# Patient Record
Sex: Female | Born: 2000 | Race: White | Hispanic: No | Marital: Single | Smoking: Never smoker
Health system: Southern US, Community
[De-identification: ages and names within clinical notes are randomized; demographics above are authoritative.]

## PROBLEM LIST (undated history)

## (undated) DIAGNOSIS — Q8789 Other specified congenital malformation syndromes, not elsewhere classified: Secondary | ICD-10-CM

## (undated) DIAGNOSIS — H6693 Otitis media, unspecified, bilateral: Secondary | ICD-10-CM

## (undated) DIAGNOSIS — Q8783 Bardet-Biedl syndrome: Secondary | ICD-10-CM

## (undated) HISTORY — PX: GALLBLADDER SURGERY: SHX652

## (undated) HISTORY — DX: Other specified congenital malformation syndromes, not elsewhere classified: Q87.89

## (undated) HISTORY — DX: Bardet-Biedl syndrome: Q87.83

## (undated) HISTORY — DX: Otitis media, unspecified, bilateral: H66.93

---
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
## 2021-01-29 ENCOUNTER — Telehealth: Payer: Self-pay | Admitting: Student in an Organized Health Care Education/Training Program

## 2021-01-29 DIAGNOSIS — Q8789 Other specified congenital malformation syndromes, not elsewhere classified: Secondary | ICD-10-CM

## 2021-01-29 NOTE — Telephone Encounter (Signed)
Called mother to offer genetic counseling. Mother would like this done so agreed to genetics referral. Order placed for Dr. Roetta Sessions and Aimee for genetic counseling.

## 2021-01-30 ENCOUNTER — Ambulatory Visit: Payer: Medicaid Other | Admitting: Student in an Organized Health Care Education/Training Program

## 2021-02-13 NOTE — Progress Notes (Addendum)
    SUBJECTIVE:   CHIEF COMPLAINT / HPI:   Hypothyroidism Most recent TSH 06/2020 was 5.520. She is currently not on any thyroid medication. She feels that she has been gaining weight, having irregular periods. Mother feels that she sweats a lot. No problems with constipation. No change in diet or exercise since last visit. Loves to dance. Very picky eater, does not eat vegetables. Used to get up in the middle of the night and eat "mayonnaise and sugar". Has seen a nutritionist in the past, doesn't help.   Concern with Menstrual Cycle  Patient notes that her periods are irregular, last LMP 12/05/20. On Micronor now due to having headaches on combined OCPs.  Her concern is that she will not be able to have kids if she does not have a period.  She does not have a partner currently and is not sexually active.  PERTINENT  PMH / PSH: Bardet-Biedl syndrome, PCOS, obesity, hypothyroidism  OBJECTIVE:   BP 122/80   Pulse (!) 116   Ht 5' 8.5" (1.74 m)   Wt (!) 324 lb (147 kg)   LMP 12/05/2020   SpO2 98%   BMI 48.55 kg/m    General: NAD, pleasant, acts younger than age, able to participate in exam Respiratory: Breathing comfortably on room air, no respiratory distress Abdomen: Obese Psych: Normal affect and mood  ASSESSMENT/PLAN:   Subclinical hypothyroidism Will recheck TSH, Free T4 and Free T3 today. Patient with notable 18 lb weight gain since last visit in November 2021. Suspect this is likely a result of poor diet. Does have irregular periods but is on Micronor which could be contributing. No other symptoms consistent with hypothyroidism. Has never been on any thyroid medications.  -TSH, Free T3, Free T4 today -F/u labs and evaluate need for treatment  Menstrual periods irregular Since starting Micronor.  Reassuringly, she has not had any more headaches since starting progesterone only pills.  She takes these at the same time every day.  She is not currently sexually active.  Shared  decision making to stay on progesterone only pills.  Reassured her about the irregular periods, likely a side effect of the birth control.   Tachycardia Unable to recheck heart rate today prior to patient leaving.  Patient without any other concerns today besides problems above. Cardiac examination not done in office but patient appeared in no distress. Attempted to call number listed in chart (mother's number) unsuccessfully. MyChart message sent to patient regarding scheduling nurse visit for heart rate recheck.   Hyperlipidemia Currently on atorvastatin 20 mg.  Last lipid panel was in November 2021.  Unclear why patient is on atorvastatin at this time given her age.  She is a very picky eater and diet has been challenging. Working towards incorporating more exercise. Will recheck lipid panel since starting on atorvastatin. Will defer to PCP to have further discussions with patient/mother about continuing on statin vs. Lifestyle modifications which would be first-line for her age.  Obesity BMI 48. With 18 lb weight increase since last visit. Discussed diet and exercise recommendations. Patient is very picky eater which makes improving diet challenging. Has seen nutritionist in past which mother states did not help much. Patient and mother will work on increasing exercise.      Sabino Dick, DO [facility]

## 2021-02-13 NOTE — Patient Instructions (Addendum)
It was wonderful to meet you today.  Please bring ALL of your medications with you to every visit.   Today we talked about:  -We are checking a TSH to assess your Thyroid function.  -We are rechecking your cholesterol as well. -Continue working on exercise and diet  Thank you for choosing [facility].   Please call [phone] with any questions about today's appointment.  Please be sure to schedule follow up at the front  desk before you leave today.   Sabino Dick, DO PGY-2 Family Medicine

## 2021-02-14 ENCOUNTER — Ambulatory Visit (INDEPENDENT_AMBULATORY_CARE_PROVIDER_SITE_OTHER): Payer: Medicaid Other | Admitting: Family Medicine

## 2021-02-14 ENCOUNTER — Encounter: Payer: Self-pay | Admitting: Family Medicine

## 2021-02-14 ENCOUNTER — Other Ambulatory Visit: Payer: Self-pay

## 2021-02-14 VITALS — BP 122/80 | HR 116 | Ht 68.5 in | Wt 324.0 lb

## 2021-02-14 DIAGNOSIS — Z68.41 Body mass index (BMI) pediatric, greater than or equal to 95th percentile for age: Secondary | ICD-10-CM

## 2021-02-14 DIAGNOSIS — E785 Hyperlipidemia, unspecified: Secondary | ICD-10-CM | POA: Diagnosis not present

## 2021-02-14 DIAGNOSIS — N926 Irregular menstruation, unspecified: Secondary | ICD-10-CM | POA: Diagnosis not present

## 2021-02-14 DIAGNOSIS — E038 Other specified hypothyroidism: Secondary | ICD-10-CM

## 2021-02-14 DIAGNOSIS — R000 Tachycardia, unspecified: Secondary | ICD-10-CM | POA: Insufficient documentation

## 2021-02-14 NOTE — Assessment & Plan Note (Signed)
Since starting Micronor.  Reassuringly, she has not had any more headaches since starting progesterone only pills.  She takes these at the same time every day.  She is not currently sexually active.  Shared decision making to stay on progesterone only pills.  Reassured her about the irregular periods, likely a side effect of the birth control.

## 2021-02-14 NOTE — Assessment & Plan Note (Addendum)
Unable to recheck heart rate today prior to patient leaving.  Patient without any other concerns today besides problems above. Cardiac examination not done in office but patient appeared in no distress. Attempted to call number listed in chart (mother's number) unsuccessfully. MyChart message sent to patient regarding scheduling nurse visit for heart rate recheck.

## 2021-02-14 NOTE — Assessment & Plan Note (Signed)
Will recheck TSH, Free T4 and Free T3 today. Patient with notable 18 lb weight gain since last visit in November 2021. Suspect this is likely a result of poor diet. Does have irregular periods but is on Micronor which could be contributing. No other symptoms consistent with hypothyroidism. Has never been on any thyroid medications.  -TSH, Free T3, Free T4 today -F/u labs and evaluate need for treatment

## 2021-02-14 NOTE — Assessment & Plan Note (Signed)
Currently on atorvastatin 20 mg.  Last lipid panel was in November 2021.  Unclear why patient is on atorvastatin at this time given her age.  She is a very picky eater and diet has been challenging. Working towards incorporating more exercise. Will recheck lipid panel since starting on atorvastatin. Will defer to PCP to have further discussions with patient/mother about continuing on statin vs. Lifestyle modifications which would be first-line for her age.

## 2021-02-15 ENCOUNTER — Ambulatory Visit: Payer: Medicaid Other

## 2021-02-15 ENCOUNTER — Other Ambulatory Visit: Payer: Self-pay

## 2021-02-15 ENCOUNTER — Encounter: Payer: Self-pay | Admitting: Family Medicine

## 2021-02-15 DIAGNOSIS — R009 Unspecified abnormalities of heart beat: Secondary | ICD-10-CM

## 2021-02-15 LAB — LIPID PANEL
Chol/HDL Ratio: 5.4 ratio — ABNORMAL HIGH (ref 0.0–4.4)
Cholesterol, Total: 177 mg/dL — ABNORMAL HIGH (ref 100–169)
HDL: 33 mg/dL — ABNORMAL LOW (ref >39)
LDL Chol Calc (NIH): 102 mg/dL (ref 0–109)
Triglycerides: 246 mg/dL — ABNORMAL HIGH (ref 0–89)
VLDL Cholesterol Cal: 42 mg/dL — ABNORMAL HIGH (ref 5–40)

## 2021-02-15 LAB — T4, FREE: Free T4: 1.14 ng/dL (ref 0.93–1.60)

## 2021-02-15 LAB — TSH: TSH: 3.38 u[IU]/mL (ref 0.450–4.500)

## 2021-02-15 LAB — T3, FREE: T3, Free: 3 pg/mL (ref 2.3–5.0)

## 2021-02-15 NOTE — Progress Notes (Signed)
Patient presents in nurse clinic for repeat heart rate from OV on 7/6, 116. Initial HR today 130, however dropped to 105 within 3 minutes.  Final HR 96. Checked BP 122/72. Patient denies chest pain, headache, blurry vision, or SOB.  Patient scheduled with PCP for 7/29 per mothers preference.  Return precautions given to mother.  Will route note to preceptor Dr. Lum Babe.

## 2021-02-15 NOTE — Progress Notes (Signed)
I went in to assess this patient with the RN staff. She is well appearing in no distress and denies chest pain, palpitations or SOB. Cardiac exam: S1 S2 normal, no murmurs. Normal RRR.  Lab reviewed with normal repeated TSH.  Likely benign tachycardia. ED precautions discussed. F/U appointment with PCP in the next few weeks for reassessment.

## 2021-02-15 NOTE — Assessment & Plan Note (Signed)
BMI 48. With 18 lb weight increase since last visit. Discussed diet and exercise recommendations. Patient is very picky eater which makes improving diet challenging. Has seen nutritionist in past which mother states did not help much. Patient and mother will work on increasing exercise.

## 2021-03-09 ENCOUNTER — Ambulatory Visit: Payer: Medicaid Other | Admitting: Student

## 2021-03-09 NOTE — Progress Notes (Deleted)
    SUBJECTIVE:   CHIEF COMPLAINT / HPI:   Heart Rate? Last seen by Dr. Melba Coon three weeks ago. At that time, was noted to be tachycardic to 116 but this was not fully evaluated at the time.  Was asked to make a return visit to address this as a specific issue. Marland Kitchen Has not previously experienced tachycardia. Has followed with [facility] in the past, last visit was in January of this year.  She had a EKG at the time significant only for sinus tachycardia.  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  She does not have a family history of Wolff-Parkinson-White but reports a medical history of her father's "heart suddenly bursting in his 56s."  Family does not know any further history.   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   PERTINENT  PMH / PSH: ***  OBJECTIVE:   There were no vitals taken for this visit.  ***  ASSESSMENT/PLAN:   No problem-specific Assessment & Plan notes found for this encounter.     Dorothyann Gibbs, MD [facility]

## 2021-03-23 ENCOUNTER — Ambulatory Visit: Payer: Medicaid Other | Admitting: Family Medicine

## 2021-04-05 ENCOUNTER — Encounter: Payer: Self-pay | Admitting: Student

## 2021-04-05 ENCOUNTER — Other Ambulatory Visit: Payer: Self-pay

## 2021-04-05 ENCOUNTER — Ambulatory Visit (INDEPENDENT_AMBULATORY_CARE_PROVIDER_SITE_OTHER): Payer: Medicaid Other | Admitting: Student

## 2021-04-05 VITALS — BP 132/74 | HR 105 | Ht 68.7 in | Wt 329.0 lb

## 2021-04-05 DIAGNOSIS — Q8789 Other specified congenital malformation syndromes, not elsewhere classified: Secondary | ICD-10-CM

## 2021-04-05 NOTE — Patient Instructions (Signed)
It was great to see you! Thank you for allowing me to participate in your care!  I recommend that you always bring your medications to each appointment as this makes it easy to ensure you are on the correct medications and helps [place] not miss when refills are needed.  Our plans for today:  -I have placed a referral for cardiology -Today you had a wart frozen from your thumb.  Please wait for a blister to form and use a Band-Aid to protect your skin.  Wait about 3 weeks and if the wart is still present, return to have it frozen once more.    Take care and seek immediate care sooner if you develop any concerns.   Dr. Erick Alley, DO [facility]

## 2021-04-05 NOTE — Progress Notes (Signed)
    SUBJECTIVE:   CHIEF COMPLAINT / HPI: History of tachycardia  PERTINENT  PMH / PSH: Subclinical hypothyroidism, PCOS, obesity, Bardet-Biedl syndrome, developmental delay, secondary amenorrhea, hyperlipidemia, irregular menstrual periods, verruca vulgaris  Tachycardia, Bardet-Biedl syndrome Patient was seen on 02/15/2021 for a nurse visit and was found to have an initial heart rate of 130 which dropped to 105 within 3 minutes and was 96 before she left the clinic.  Per the note she denied chest pain, headache, blurry vision and shortness of breath. Last TSH from 02/14/2021 was normal.  Today heart rate is 105.  Patient denies lightheadedness/dizziness, abdominal pain, headache.  States she does not feel like her heart is beating fast and does not feel any palpitations.  She states she thinks her heart is beating just fine and she feels good today.   Patient has Bardet-Biedl syndrome and has previously been seeing a pediatric cardiologist with [facility] yearly. Now that patient is 19 she needs a referral to an adult cardiologist.  Verruca vulgaris Patient has a wart on the pad of her left thumb which has been present for about a month.  Patient states it becomes irritated and slightly painful when she draws which she likes to do regularly.  OBJECTIVE:   Vitals:   04/05/21 1620  BP: 132/74  Pulse: (!) 105  SpO2: 97%     General: NAD, pleasant, able to participate in exam Cardiac: Tachycardic, regular rhythm, no murmurs. Respiratory: CTAB, normal effort, No wheezes, rales or rhonchi Extremities: no edema or cyanosis. Skin: Lesion of verruca vulgaris on pad of left thumb Neuro: alert, no obvious focal deficits Psych: Normal affect and mood  ASSESSMENT/PLAN:   Bardet-Biedl syndrome, tachycardia -Ambulatory referral placed to an adult cardiologist in [place] area to follow-up with patient's tachycardia and Bardet-Biedl syndrome   verruca vulgaris -Cryotherapy was used in the office  today in an attempt to freeze the wart off -Written consent for the procedure was obtained by the mother -Patient was instructed to look for blister formation, use a Band-Aid to protect the skin, return in about 3 weeks if the wart is still there and we can repeat the procedure     Dr. Erick Alley, DO [facility]

## 2021-04-06 ENCOUNTER — Telehealth: Payer: Self-pay | Admitting: *Deleted

## 2021-04-06 NOTE — Telephone Encounter (Signed)
LVM for pts mom to call office back.  I was checking for the legal guardian documentation in both of her child's charts and did not see it anywhere and wanted to ask her to bring in the documentation so that we can scan it in their charts so that we can legally discuss the health information and so that there won't be any problems in the future with this. Anthany Thornhill Zimmerman Rumple, CMA

## 2021-04-09 ENCOUNTER — Other Ambulatory Visit: Payer: Self-pay | Admitting: Student in an Organized Health Care Education/Training Program

## 2021-04-17 NOTE — Progress Notes (Signed)
Cardiology Office Note  Date:  04/18/2021   ID:  Cassandra Faulkner, DOB 02/19/2001, MRN [id number]  PCP:  [name], MD   Chief Complaint  Patient presents with   New Patient (Initial Visit)    Ref by Dr. Jennette Kettle for tachycardia. Medications reviewed by the patient's mother Irving Burton); on the [facility] to contact. Patient has shortness of breath and rapid heartbeats.     HPI:  Ms. Annajulia Lewing is a 20 year old woman with past medical history of Bardet-Biedl syndrome  Retinitis pigmentosa with visual difficulties,S/P surgery for polydactyly.  Morbid obesity PCOS Who presents by referral from Dr. Denny Levy for consultation of her tachycardia  Previously followed by pediatric cardiology Last seen January 2022 Prior notes indicating  no evidence of cardiac disease.  BPs measured at home have been normal.  She she has been taking atorvastatin for cholesterol over 200  Also was started on 4 OCPs.  thyroid dysfunction, which has since improved  Mother who presents with her today reports she has had significant weight gain She graduated from school, has had no activities, more sedentary  Echo done at outside facility shows normal biventricular size and systolic function  Mother reports mildly elevated heart rate at home when they periodically " Spot" check it.  Typically around 100 sometimes last No prior monitor available She reports that she is asymptomatic  EKG personally reviewed by myself on todays visit Sinus tachycardia rate 108 bpm nonspecific T wave abnormality   PMH:   has a past medical history of Bardet-Biedl syndrome and Chronic otitis media of both ears.  PSH:    Past Surgical History:  Procedure Laterality Date   GALLBLADDER SURGERY      Current Outpatient Medications  Medication Sig Dispense Refill   atorvastatin (LIPITOR) 20 MG tablet TAKE 1 TABLET BY MOUTH EVERY DAY 90 tablet 0   Multiple Vitamins-Minerals (MULTIVITAMIN WITH MINERALS) tablet Take 1 tablet by  mouth daily.     norethindrone (ORTHO MICRONOR) 0.35 MG tablet Take 1 tablet (0.35 mg total) by mouth daily. 28 tablet 11   vitamin E 400 UNIT capsule Take 400 Units by mouth daily.     No current facility-administered medications for this visit.    Allergies:   Patient has no known allergies.   Social History:  The patient  reports that she has never smoked. She has never used smokeless tobacco. She reports that she does not drink alcohol.   Family History:   family history includes Breast cancer in her mother; Chromosomal disorder in her sister; Diabetes in her father; Hypertension in her father.    Review of Systems: Review of Systems  Constitutional: Negative.   HENT: Negative.    Respiratory: Negative.    Cardiovascular: Negative.   Gastrointestinal: Negative.   Musculoskeletal: Negative.   Neurological: Negative.   Psychiatric/Behavioral: Negative.    All other systems reviewed and are negative.   PHYSICAL EXAM: VS:  BP 126/80 (BP Location: Right Arm, Patient Position: Sitting, Cuff Size: Large)   Pulse (!) 108   Ht 5\' 7"  (1.702 m)   Wt (!) 328 lb 4 oz (148.9 kg)   SpO2 99%   BMI 51.41 kg/m  , BMI Body mass index is 51.41 kg/m. GEN: Well nourished, well developed, in no acute distress HEENT: normal Neck: no JVD, carotid bruits, or masses Cardiac: RRR; no murmurs, rubs, or gallops,no edema  Respiratory:  clear to auscultation bilaterally, normal work of breathing GI: soft, nontender, nondistended, + BS MS:  no deformity or atrophy Skin: warm and dry, no rash Neuro:  Strength and sensation are intact Psych: euthymic mood, full affect    Recent Labs: 06/19/2020: ALT 17; BUN 10; Creatinine, Ser 0.63; Hemoglobin 12.8; Platelets 418; Potassium 4.4; Sodium 141 02/14/2021: TSH 3.380    Lipid Panel Lab Results  Component Value Date   CHOL 177 (H) 02/14/2021   HDL 33 (L) 02/14/2021   LDLCALC 102 02/14/2021   TRIG 246 (H) 02/14/2021      Wt Readings from Last 3  Encounters:  04/18/21 (!) 328 lb 4 oz (148.9 kg) (>99 %, Z= 2.90)*  04/05/21 (!) 329 lb (149.2 kg) (>99 %, Z= 2.90)*  02/14/21 (!) 324 lb (147 kg) (>99 %, Z= 2.86)*   * Growth percentiles are based on CDC (Girls, 2-20 Years) data.       ASSESSMENT AND PLAN:  Problem List Items Addressed This Visit       Cardiology Problems   Hyperlipidemia   Relevant Orders   EKG 12-Lead     Other   Tachycardia   Relevant Orders   EKG 12-Lead   LONG TERM MONITOR (3-14 DAYS)   Bardet-Biedl syndrome - Primary   Relevant Orders   EKG 12-Lead   Sinus tachycardia Likely benign finding.  She is asymptomatic Heart rate appears to be typically 90-100 on prior office visits Out of an abundance of caution, a ZIO monitor has been ordered Prior work-up including echocardiography unrevealing with normal LV function, no structural heart disease Will likely not require beta-blockade We have recommended regular walking program for conditioning  Morbid obesity Discussed walking program, lifestyle modification, calorie restriction Mother reports significant weight gain over the past year as she has been in school and now sedentary.  Discussed types of activities that she could get involved with  Hyperlipidemia Numbers not particularly impressive Given young age, no strong indication for treatment, would rather pursue lifestyle modification, weight loss  Bardet-Biedl syndrome  Discussed various findings with patient's mother Stressed importance of lifestyle modification as above   Total encounter time more than 60 minutes  Greater than 50% was spent in counseling and coordination of care with the patient  Patient seen in consultation for Dr. Jennette Kettle will be referred back to her office for ongoing care of the issues detailed above   Signed, Dossie Arbour, M.D., Ph.D. [facility] [place], [place] [phone]

## 2021-04-18 ENCOUNTER — Encounter: Payer: Self-pay | Admitting: Cardiovascular Disease

## 2021-04-18 ENCOUNTER — Other Ambulatory Visit: Payer: Self-pay

## 2021-04-18 ENCOUNTER — Ambulatory Visit (INDEPENDENT_AMBULATORY_CARE_PROVIDER_SITE_OTHER): Payer: Medicaid Other

## 2021-04-18 ENCOUNTER — Ambulatory Visit (INDEPENDENT_AMBULATORY_CARE_PROVIDER_SITE_OTHER): Payer: Medicaid Other | Admitting: Cardiovascular Disease

## 2021-04-18 VITALS — BP 126/80 | HR 108 | Ht 67.0 in | Wt 328.2 lb

## 2021-04-18 DIAGNOSIS — R000 Tachycardia, unspecified: Secondary | ICD-10-CM

## 2021-04-18 DIAGNOSIS — E782 Mixed hyperlipidemia: Secondary | ICD-10-CM | POA: Diagnosis not present

## 2021-04-18 DIAGNOSIS — Q8789 Other specified congenital malformation syndromes, not elsewhere classified: Secondary | ICD-10-CM

## 2021-04-18 NOTE — Patient Instructions (Addendum)
Medication Instructions:  No changes  If you need a refill on your cardiac medications before your next appointment, please call your pharmacy.   Lab work: No new labs needed  Testing/Procedures: Heart monitor (Zio patch) for 2 weeks (14 days)   Your physician has recommended that you wear a Zio monitor. This monitor is a medical device that records the heart's electrical activity. Doctors most often use these monitors to diagnose arrhythmias. Arrhythmias are problems with the speed or rhythm of the heartbeat. The monitor is a small device applied to your chest. You can wear one while you do your normal daily activities. While wearing this monitor if you have any symptoms to push the button and record what you felt. Once you have worn this monitor for the period of time provider prescribed (Usually 14 days), you will return the monitor device in the postage paid box. Once it is returned they will download the data collected and provide [place] with a report which the provider will then review and we will call you with those results. Important tips:  Avoid showering during the first 24 hours of wearing the monitor. Avoid excessive sweating to help maximize wear time. Do not submerge the device, no hot tubs, and no swimming pools. Keep any lotions or oils away from the patch. After 24 hours you may shower with the patch on. Take brief showers with your back facing the shower head.  Do not remove patch once it has been placed because that will interrupt data and decrease adhesive wear time. Push the button when you have any symptoms and write down what you were feeling. Once you have completed wearing your monitor, remove and place into box which has postage paid and place in your outgoing mailbox.  If for some reason you have misplaced your box then call our office and we can provide another box and/or mail it off for you.   Follow-Up: At [facility], you and your health needs are our priority.   As part of our continuing mission to provide you with exceptional heart care, we have created designated Provider Care Teams.  These Care Teams include your primary Cardiologist (physician) and Advanced Practice Providers (APPs -  Physician Assistants and Nurse Practitioners) who all work together to provide you with the care you need, when you need it.  You will need a follow up appointment in 12 months  Providers on your designated Care Team:   Nicolasa Ducking, NP Eula Listen, PA-C Marisue Ivan, PA-C [place], [place]  COVID-19 Vaccine Information can be found at: [email or web address] For questions related to vaccine distribution or appointments, please email [email or web address] or call [phone].

## 2021-05-09 NOTE — Progress Notes (Signed)
MEDICAL GENETICS NEW PATIENT EVALUATION  Patient name: Cassandra Faulkner DOB: [birth year] Age: 20 y.o. MRN: [id number]  Referring Provider/Specialty: Chrisandra Netters, MD / Family Medicine Date of Evaluation: 05/10/2021 Chief Complaint/Reason for Referral: Hart Robinsons syndrome  HPI: Cassandra Faulkner is a 20 y.o. 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 She is accompanied by her sister and mother at today's visit. Her sister, Donnetta Simpers, also has Bardet Biedl syndrome.  Dilyn and her sister were diagnosed with Bardet Biedl syndrome clinically by Dr. Abelina Bachelor as infants. 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 These extra digits were surgically removed shortly after birth. She has had normal renal ultrasounds in the past and does not have any blood pressure concerns. She has a history of gallstones at 62 and underwent cholecystectomy. Mother states liver labs have been normal so she has not had an abdominal ultrasound since that time. She recently saw a cardiologist due to an abnormal heart rate. EKG and echocardiogram in July were normal. This was thought to be benign tachycardia but they did recommend a ZIO monitor.   Laryn is overweight- mother reports particular concerns in this regard as she has gained a lot of weight in the past year. This seems to be related to hyperphagia. She has excessive hunger and will eat constantly until she feels sick. Samariah does not have signs of diabetes. She has had a sleep study that was normal. She has had thyroid studies that were slightly abnormal in the past but she has not required medication. She has experienced irregular periods, so was on birth control that contained estrogen but experienced migraines. She is now on another form of birth control but has not had a period in 6-7 months. Mother states someone may have diagnosed her with PCOS. Maciel has not seen a gynecologist or  had a pap smear. She has expressed interest in having children in the future.  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 Mother states her vision loss has not progressed as quickly as her sister's. She follows with a retinal specialist yearly. Developmentally, Sherryn was delayed in meeting milestones. She was in resource classes in school and recently graduated high school. She is interested in getting a job and would like to work with animals. She does have a desire to be more independent, social, and have a boyfriend. She and her sister would like to move into a house together.   Prior genetic testing has not been performed in [place] herself. Lovena Le and her sister met with genetic counselor Bennie Pierini and [facility] ophthalmologist Dr. Cathren Laine. They sent the [facility] Bardet-Biedl gene sequencing panel on Cassandra Faulkner's older sister. 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 It is presumed that Cassandra Faulkner has the same homozygous variant.  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  Therapies -- occupational and speech in the past.  Toilet training -- yes.  School -- Resource classes in school. Recently graduated high school 2022. Interested in getting a job.  Social  History: Social History   Social History Narrative   Lives with mother, father, and sister    Medications: Current Outpatient Medications on File Prior to Visit  Medication Sig Dispense Refill   atorvastatin (LIPITOR) 20 MG tablet TAKE 1 TABLET BY MOUTH EVERY DAY 90 tablet 0   Multiple Vitamins-Minerals (MULTIVITAMIN WITH MINERALS) tablet Take 1 tablet by mouth daily.     norethindrone (ORTHO MICRONOR) 0.35 MG tablet Take 1 tablet (0.35 mg total) by mouth daily. 28 tablet 11   vitamin E 400 UNIT capsule Take 400 Units by mouth daily.     No current facility-administered medications on file prior to visit.    Allergies:  No Known Allergies  Immunizations: up to date  Review of Systems: General: Bardet-Biedl syndrome. Generally doing well- happy and healthy. Interested in more independence and getting a job. Overweight. Eyes/vision: Nyctalopia and photophobia. Sees retinal specialist yearly. Wears glasses. Ears/hearing: no concerns. Dental: no concerns. Respiratory: no concerns. Cardiovascular: tachycardia- thought to be benign. Normal echocardiogram. Following with Cardiology. Gastrointestinal: h/o gallstones s/p cholecystectomy. Normal liver labs. No recent abdominal imaging. Genitourinary: Normal renal ultrasound in the past. Has not seen gynecologist or had pap smear but working on this. No pelvic imaging. Endocrine: Irregular periods. On OCPs. Possible PCOS. Slightly abnormal thyroid studies in the past- no medication needed. Hematologic: no concerns. Immunologic: no concerns. Neurological: developmental delays. No seizures. Psychiatric: working on activities of daily living and becoming more independent. 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 Skin, Hair, Nails: no concerns.  Family History: Cassandra Faulkner has an older sister, Donnetta Simpers who is 45 years old, with Bardet-Biedl syndrome. There are no other siblings. Parents are presumed  to be carriers of BBS. No other family members are known to have BBS.   Mother was diagnosed with breast cancer within the last few years and underwent chemotherapy and bilateral mastectomy. She is currently in remission. She states that she did genetic testing for breast cancer genes and it was normal.  Consanguinity: Denies  Physical Examination: Weight: 147.4 kg (99%) Height: 5'8.11" (93%) Head circumference: 59.1 cm (99%)  Ht 5' 8.11" (1.73 m)   Wt (!) 325 lb (147.4 kg)   HC 59.1 cm (23.27")   BMI 49.26 kg/m   General: Alert, shy initially and watching videos on phone, but later engages well in conversation, high-pitched voice Head: Normocephalic Eyes: Normoset, Normal lids, lashes, brows, wearing glasses Nose: Normal appearance Lips/Mouth/Teeth: Normal appearance Ears: Normoset and normally formed, no pits, tags or creases Neck: Normal appearance Heart: Warm and well perfused Lungs: No increased work of breathing Neurologic: Normal gross motor by observation, no abnormal movements Psych: Pleasant demeanor, responses to questions have immaturity compared to chronological age Extremities: Symmetric and proportionate Hands/Feet: Normal hands, fingers and nails; there are small soft tissue remnants from prior postaxial polydactyly on the hands; broad feet without polydactyly  Photo of patient in media tab (parental verbal consent obtained)  Prior Genetic testing: None in [place] herself but older sister had the BBS gene panel done in 2018 which showed a 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  Pertinent Labs: 2021: CBC, CMP, Lipid panel, TSH, Hep C antibody, Hgb A1c, FSH, LH, prolactin, testosterone  2022: TSH,  free T4, free T3, lipid panel  Pertinent Imaging/Studies: ECHO: 08/2020 Liver US: 2019 Most recent complete imaging with kidneys included): 2019; MRI showed large kidneys No pelvic  imaging?  Assessment: KEYLE DOBY is a 20 y.o. 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 Growth parameters show significant obesity with BMI 49. She does have hyperphagia and trouble with satiety. Today was our first time meeting Harlean and her sister. We discussed the following:  About BBS Bardet-Biedl syndrome (BBS) is a genetic condition that affects many body systems. Symptoms may vary between individuals even within the same family. Some of the major features include extra fingers and toes (polydactyly), obesity related to hyperphagia, developmental delays and learning differences, dental abnormalities, loss of sense of smell, hypogonadotropic hypogonadism, genitourinary abnormalities, renal and liver disease, cardiac abnormalities, and vision concerns. Vision abnormalities typically begin with night blindness in mid childhood, followed by loss of peripheral vision and poor visual acuity.    Inheritance There are several genes known to be associated with BBS. The particular gene causing BBS in [place] is BBS1 (Note: her sister was tested and found to have homozygous pathogenic variants in BBS1. Ziaire has not been tested but is presumed to have the same variants). BBS is inherited in an autosomal recessive pattern. This means that a pathogenic variant (mutation) in both copies of a gene must be present in order to cause symptoms. Typically, if an individual has BBS, they inherited one pathogenic variant from each parent. Each parent, having only one pathogenic variant, would be considered a carrier of BBS. Carriers are typically unaffected, but have a 25% chance of having a child with BBS in each pregnancy if their partner is also a carrier. Individuals with BBS frequently have fertility difficulties, but some have been able to have a child. Each child of an individual with BBS would inherit one of the  pathogenic variants and would be an unaffected carrier. If an individual with BBS has a child with a carrier, then there is a 50% chance of having a child with BBS.  Management The following surveillance is recommended in those with BBS per [employer]: At each visit: Measure height, weight, head and waist circumference Assess daily physical activity level and dietary intake Measure blood pressure Assess for symptoms of scoliosis, sleep apnea, recurrent infection, IBD, celiac disease, neurogenic bladder, and bladder outflow obstruction Every 6 months Routine dental care Annually Ophthalmology- to include visual field testing, electroretinography, and assessment for cataracts Liver and renal ultrasound Labs- hepatic enzymes, PT, PTT, CBC, serum electrolytes, creatinine, BUN, cystatin C, lipid panel, fasting blood glucose, HgbA1c, thyroid gland function, FSH, LH, estrogen, testosterone Other Initial echocardiogram to assess for cardiac defects and cardiomyopathy; Complete abdominal ultrasound to assess for laterality defects If normal, further evaluation only needed if cardiac symptoms develop Initial pelvic ultrasound in females to assess for structural differences Consider brain MRI if neurological abnormalities Consider neuropsychiatric evaluation is signs/symptoms of atypical behaviors or mood disorder Consider skeletal survey if signs of scoliosis or joint disease  Of note, a new treatment called IMCIVREE (setmelanotide) was recently approved by the FDA for use in individuals with BBS to help manage hyperphagia and weight gain. The family is interested in learning more about this. We will refer them to Dr. Guadalupe Dawn, as he participated in the clinical trials for this drug in adults and adolescents. Endocrinology would also be able to help the family with general  obesity management.  The family was also interested in learning about research opportunities related to BBS, and social  opportunities for young adults with disabilities. Ricquel and her sister are interested in becoming more independent. We will reach out to the family support network and others within the genetics community to try to identify some relevant resources. The family is aware of the [employer]. They were also encouraged to explore groups for BBS on Facebook and other social media sites. We will plan to check in with the family in a year (next summer) to see how things are going and assess management in the meantime.   Recommendations: See above for full routine management guidelines for BBS. Caramia is mostly up to date and has established care with a PCP, Cardiologist, Ophthalmologist and retinal specialist. There are annual labs and imaging she is still needing and we encouraged mom to discuss this with the PCP at their upcoming annual physical. Referral to [place], Dr. Jillene Bucks, for medication discussion and obesity management (placed) We will inquire about research opportunities and support groups   Follow-up in ~9 months (around June 2023).  Heidi Dach, MS, [facility] Certified Genetic Counselor  Artist Pais, D.O. Attending Physician, [facility] Date: 05/11/2021 Time: 4:44pm   Total time spent: 80 minutes Time spent includes face to face and non-face to face care for the patient on the date of this encounter (history and physical, genetic counseling, coordination of care, data gathering and/or documentation as outlined)

## 2021-05-10 ENCOUNTER — Other Ambulatory Visit: Payer: Self-pay

## 2021-05-10 ENCOUNTER — Encounter (INDEPENDENT_AMBULATORY_CARE_PROVIDER_SITE_OTHER): Payer: Self-pay | Admitting: Pediatric Genetics

## 2021-05-10 ENCOUNTER — Ambulatory Visit (INDEPENDENT_AMBULATORY_CARE_PROVIDER_SITE_OTHER): Payer: Medicaid Other | Admitting: Pediatric Genetics

## 2021-05-10 VITALS — Ht 68.11 in | Wt 325.0 lb

## 2021-05-10 DIAGNOSIS — Q8789 Other specified congenital malformation syndromes, not elsewhere classified: Secondary | ICD-10-CM | POA: Diagnosis not present

## 2021-05-10 DIAGNOSIS — R632 Polyphagia: Secondary | ICD-10-CM

## 2021-05-21 ENCOUNTER — Telehealth: Payer: Self-pay

## 2021-05-21 NOTE — Telephone Encounter (Signed)
Was able to reach out to pt, mother Cassandra Faulkner answer phone, (DPR approved) reviewed Cassandra Faulkner's recent ZIO monitor results. Dr. Mariah Milling advised based on the current results   Event monitor  Normal rhythm  No significant arrhythmia noted  Patient triggered events (2) not associated with significant arrhythmia   Cassandra Faulkner verbalized understanding, is thankful for the results call, however, had already seen results posted to MyChart by Dr. Mariah Milling and has sent Dr. Mariah Milling a MyChart message with concerns for max HR 188 and pt's genetic profile. Advised Dr. Mariah Milling is not in clinic this week, and should expect a response within the week or so for explanation and if a medication needs to be added PRN.   Cassandra Faulkner verbalized understanding and will await Dr. Windell Hummingbird response, otherwise, all questions and concerns were address. Will call back for anything further, f/u as schedule.

## 2021-06-02 ENCOUNTER — Encounter (INDEPENDENT_AMBULATORY_CARE_PROVIDER_SITE_OTHER): Payer: Self-pay

## 2021-06-05 ENCOUNTER — Encounter (INDEPENDENT_AMBULATORY_CARE_PROVIDER_SITE_OTHER): Payer: Self-pay | Admitting: Pediatric Genetics

## 2021-06-06 ENCOUNTER — Encounter (INDEPENDENT_AMBULATORY_CARE_PROVIDER_SITE_OTHER): Payer: Self-pay

## 2021-06-08 ENCOUNTER — Ambulatory Visit (INDEPENDENT_AMBULATORY_CARE_PROVIDER_SITE_OTHER): Payer: Medicaid Other | Admitting: Pediatric Genetics

## 2021-06-12 ENCOUNTER — Ambulatory Visit: Payer: Medicaid Other | Admitting: Cardiovascular Disease

## 2021-06-20 ENCOUNTER — Ambulatory Visit (INDEPENDENT_AMBULATORY_CARE_PROVIDER_SITE_OTHER): Payer: Medicaid Other | Admitting: Pediatric Genetics

## 2021-06-20 NOTE — Progress Notes (Signed)
Cardiology Office Note:    Date:  06/21/2021   ID:  Cassandra Faulkner, DOB [birth year], MRN [id number]  PCP:  [name], MD  [facility] Cardiologist:  None  [facility] Electrophysiologist:  None   Referring MD: [name], MD   Chief Complaint: heart monitor results  History of Present Illness:    Cassandra Faulkner is a 20 y.o. female with a hx of Bardet-Biedi syndrome, retinitis pigmentosa with visual difficulties, s/p surgery polydactyly, thyroid dysfunction, morbid obesity, PCOS who presents for follow-up.   Previously followed by pediatric cardiology. Prior notes indicate no evidence of cardiac disease. Echo at outside facility showed normal systolic function.   Last seen 04/18/21 for shortness of breath and rapid heartbeats. EKG showed sinus tachycardia, asymptomatic. A heart monitor was ordered.   Today, heart monitor reviewed. Mother says she has gained 20-25lbs in the last 2 months. Is following at [facility] for Bardet-Biedi syndrome. Says during the heart monitor she was very sedentary, concerned heart rate was spiking. No activity changes. BP today borderline, 110-120/80-90s. She will get labs tomorrow at PCP. Re-check echo.   Past Medical History:  Diagnosis Date   Bardet-Biedl syndrome    Chronic otitis media of both ears     Past Surgical History:  Procedure Laterality Date   GALLBLADDER SURGERY      Current Medications: Current Meds  Medication Sig   atorvastatin (LIPITOR) 20 MG tablet TAKE 1 TABLET BY MOUTH EVERY DAY   metoprolol tartrate (LOPRESSOR) 25 MG tablet Take 0.5 tablets (12.5 mg total) by mouth 2 (two) times daily.   Multiple Vitamins-Minerals (MULTIVITAMIN WITH MINERALS) tablet Take 1 tablet by mouth daily.   norethindrone (ORTHO MICRONOR) 0.35 MG tablet Take 1 tablet (0.35 mg total) by mouth daily.   vitamin E 400 UNIT capsule Take 400 Units by mouth daily.     Allergies:   Patient has no known allergies.   Social History    Socioeconomic History   Marital status: Single    Spouse name: Not on file   Number of children: Not on file   Years of education: Not on file   Highest education level: Not on file  Occupational History   Not on file  Tobacco Use   Smoking status: Never    Passive exposure: Never   Smokeless tobacco: Never  Substance and Sexual Activity   Alcohol use: No   Drug use: Not on file   Sexual activity: Not on file  Other Topics Concern   Not on file  Social History Narrative   Lives with mother, father, and sister   Social Determinants of Health   Financial Resource Strain: Not on file  Food Insecurity: Not on file  Transportation Needs: Not on file  Physical Activity: Not on file  Stress: Not on file  Social Connections: Not on file     Family History: The patient's family history includes Breast cancer in her mother; Chromosomal disorder in her sister; Diabetes in her father; Hypertension in her father.  ROS:   Please see the history of present illness.     All other systems reviewed and are negative.  EKGs/Labs/Other Studies Reviewed:    The following studies were reviewed today:  Heart monitor 04/2021 Event Monitor Patch Wear Time:  14 days and 0 hours (2022-09-07T15:58:48-0400 to 2022-09-21T15:58:52-0400)   Patient had a min HR of 44 bpm, max HR of 188 bpm, and avg HR of 93 bpm.  Predominant underlying rhythm was Sinus  Rhythm.    Isolated SVEs were rare (<1.0%), and no SVE Couplets or SVE Triplets were present. Isolated VEs were rare (<1.0%), and no VE Couplets or VE Triplets were present.    Patient triggered events (2) not associated with significant arrhythmia   Signed, Dossie Arbour, MD, Ph.D [facility]    Echo 08/16/20 PROCEDURE INFORMATION  The image quality was adequate due to body habitus.  The subcostal windows were limited.    SEGMENTAL ANATOMY  There is levocardia with atrial situs solitus, D-looped ventricles, normally related great arteries  (S,D,S).    VEINS  There is an intact right sided inferior vena cava connected to the right atrium.  The superior vena caval connections are not well visualized.  One right sided pulmonary vein is seen connecting to the left atrium.  The flow pattern in the right lower  pulmonary vein is normal.    ATRIA  The atrial septum is not well visualized.Normal right atrial size.Normal left atrial size.    INLETS/ATRIOVENTRICULAR VALVES  The tricuspid valve is normal.  No tricuspid valve obstruction, there is trace regurgitation.  Inadequate TR to measure RV pressure.  No mitral valve obstruction, with no regurgitation.  The mitral valve is normal.  VENTRICLES  The right ventricular morphology, size and systolic function are normal.  The left ventricular morphology, size and systolic function are normal.  The ventricular septum appears intact.    OUTLETS/SEMILUNAR VALVES   The right ventricular outflow tract is unobstructed.  The pulmonary valve mobility is normal.   The left ventricular outflow tract is unobstructed.  The pulmonary valve is unobstructed.  Trace pulmonary valve regurgitation.  The aortic valve morphology  is not well visualized.  The aortic valve is unobstructed.  There may be minimal aortic valve insufficiency seen in only one clip from PSAX views.    CORONARY ARTERIES  Coronary arteries not well seen    GREAT ARTERIES  The aortic root diameter at the sinuses of Valsalva and the ascending aorta diameter are normal.  Normal main pulmonary and branch pulmonary arteries.  Left aortic arch.  There is normal aortic arch branching.  The abdominal aortic flow is normal.  There   is no evidence of coarctation of the aorta.  A patent ductus arteriosus could not be excluded.    EXTRACARDIAC  There is no pericardial effusion.     EKG:  EKG is  ordered today.  The ekg ordered today demonstrates ST, 106bpm, no ST/T wave changes  Recent Labs: 02/14/2021: TSH 3.380  Recent Lipid Panel    Component  Value Date/Time   CHOL 177 (H) 02/14/2021 1629   TRIG 246 (H) 02/14/2021 1629   HDL 33 (L) 02/14/2021 1629   CHOLHDL 5.4 (H) 02/14/2021 1629   CHOLHDL 5.5 (H) 09/16/2016 1543   VLDL 31 (H) 09/16/2016 1543   LDLCALC 102 02/14/2021 1629    Physical Exam:    VS:  BP 100/80 (BP Location: Right Wrist, Patient Position: Sitting, Cuff Size: Normal)   Pulse (!) 106   Ht 5\' 9"  (1.753 m)   Wt (!) 324 lb 6 oz (147.1 kg)   BMI 47.90 kg/m     Wt Readings from Last 3 Encounters:  06/21/21 (!) 324 lb 6 oz (147.1 kg) (>99 %, Z= 2.90)*  05/10/21 (!) 325 lb (147.4 kg) (>99 %, Z= 2.89)*  04/18/21 (!) 328 lb 4 oz (148.9 kg) (>99 %, Z= 2.90)*   * Growth percentiles are based on CDC (Girls, 2-20 Years)  data.     GEN:  Well nourished, well developed in no acute distress HEENT: Normal NECK: No JVD; No carotid bruits LYMPHATICS: No lymphadenopathy CARDIAC: tachycardia, RR, no murmurs, rubs, gallops RESPIRATORY:  Clear to auscultation without rales, wheezing or rhonchi  ABDOMEN: Soft, non-tender, non-distended MUSCULOSKELETAL:  No edema; No deformity  SKIN: Warm and dry NEUROLOGIC:  Alert and oriented x 3 PSYCHIATRIC:  Normal affect   ASSESSMENT:    1. Tachycardia   2. Bardet-Biedl syndrome   3. Dyspnea, unspecified type   4. Mixed hyperlipidemia    PLAN:    In order of problems listed above:  Sinus tachycardia Heart monitor showed predominately NSR with avg heart rate 90s. EKG today shows heart rate 106bpm. Unable to assess symptoms given developmental delays. Suspect recent weight gain and sedentary lifestyle contributing to heart rate elevation at times. Prior echo 08/2020 showed normal LVEF. Will repeat echo. Patient will gets general labs tomorrow by PCP. BP borderline, will trial Lopressor 12.5mg BID and reassess heart rate in 1 month. Recommend monitor heart rate and BP at home.   Morbid obesity Has gained 20-25lbs in the last 2 months, lifestyle is very sedentary. Planning on seeing  specialist at [facility] for appetite control.   HLD LDL 102. Continue Lipitor.  Bardet-Biedi  Followed closely by PCP and [facility], possibly contributing to symptoms/weight gain.  Disposition: Follow up in 1 month(s) with MD/APP    Signed, Payden Docter David Stall, PA-C  06/21/2021 9:06 AM    [facility]

## 2021-06-21 ENCOUNTER — Ambulatory Visit (INDEPENDENT_AMBULATORY_CARE_PROVIDER_SITE_OTHER): Payer: Medicaid Other | Admitting: Medical

## 2021-06-21 ENCOUNTER — Other Ambulatory Visit: Payer: Self-pay

## 2021-06-21 ENCOUNTER — Encounter: Payer: Self-pay | Admitting: Medical

## 2021-06-21 VITALS — BP 100/80 | HR 106 | Ht 69.0 in | Wt 324.4 lb

## 2021-06-21 DIAGNOSIS — R0600 Dyspnea, unspecified: Secondary | ICD-10-CM | POA: Diagnosis not present

## 2021-06-21 DIAGNOSIS — E782 Mixed hyperlipidemia: Secondary | ICD-10-CM | POA: Diagnosis not present

## 2021-06-21 DIAGNOSIS — R000 Tachycardia, unspecified: Secondary | ICD-10-CM | POA: Diagnosis not present

## 2021-06-21 DIAGNOSIS — Q8789 Other specified congenital malformation syndromes, not elsewhere classified: Secondary | ICD-10-CM | POA: Diagnosis not present

## 2021-06-21 MED ORDER — METOPROLOL TARTRATE 25 MG PO TABS: 12.5000 mg | ORAL_TABLET | Freq: Two times a day (BID) | ORAL | 3 refills | Status: DC

## 2021-06-21 NOTE — Progress Notes (Signed)
SUBJECTIVE:   CHIEF COMPLAINT / HPI:   PCOS- Taking micronor daily and tolerating this well. Not having periods but would prefer to switch to a regimen that allows her to have periods as she is interested in eventually being married/having children. Not currently sexually active. Last period was April 26. Growing sideburns and chest hair which are bothersome to her.    Hypertension- BP elevated to 149/86 today. Just established with cards for tachycardia and seen for the first time yesterday, was started on lopressor 12.5mg  BID but has not started taking this yet.    Obesity- Weight today up to 325 from 318 1 year prior. Establishing with Dr. 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  Exercise: Dancing, joining a twice weekly group. Mom working on setting up family walks.   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 Symptoms may vary between individuals even within the same family. Some of the major features include extra fingers and toes (polydactyly), obesity related to hyperphagia, developmental delays and learning differences, dental abnormalities, loss of sense of smell, hypogonadotropic hypogonadism, genitourinary abnormalities, renal and liver disease, cardiac abnormalities, and vision concerns. Vision abnormalities typically begin with night blindness in mid childhood, followed by loss of peripheral vision and poor visual acuity.    The following surveillance is recommended in those with BBS per [employer]: At each visit: Measure height, weight, head and waist circumference Assess daily physical activity level and dietary intake Measure blood pressure Assess for symptoms of scoliosis, sleep apnea, recurrent infection, IBD, celiac disease, neurogenic bladder, and bladder outflow obstruction Patient reports excessive  daytime sleepiness and mother reports snoring. Never had a sleep study.  Every 6 months Routine dental care. Yes.  Annually Ophthalmology- to include visual field testing, electroretinography, and assessment for cataracts. Yes- [employer].  Liver and renal ultrasound Labs- hepatic enzymes, PT, PTT, CBC, serum electrolytes, creatinine, BUN, cystatin C, lipid panel, fasting blood glucose, HgbA1c, thyroid gland function, FSH, LH, estrogen, testosterone. Recently had thyroid panel as part of sinus tachycardia workup.  Other Initial echocardiogram to assess for cardiac defects and cardiomyopathy; Complete abdominal ultrasound to assess for laterality defects If normal, further evaluation only needed if cardiac symptoms develop Initial pelvic ultrasound in females to assess for structural differences. Never done.  Consider brain MRI if neurological abnormalities Consider neuropsychiatric evaluation is signs/symptoms of atypical behaviors or mood disorder Consider skeletal survey if signs of scoliosis or joint disease   OBJECTIVE:   BP (!) 149/86   Pulse 99   Wt (!) 325 lb (147.4 kg)   SpO2 99%   BMI 47.99 kg/m   Physical Exam Constitutional:      General: She is not in acute distress.    Appearance: She is obese.  HENT:     Mouth/Throat:     Mouth: Mucous membranes are moist.     Comments: Chipped front left incisor Neck:     Thyroid: No thyromegaly or thyroid tenderness.  Cardiovascular:     Rate and Rhythm: Normal rate and regular rhythm.     Heart sounds: No murmur heard.   No friction rub. No gallop.  Pulmonary:     Effort: Pulmonary effort is normal.     Breath sounds: Normal breath sounds.  Lymphadenopathy:     Cervical: No cervical adenopathy.     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 Weight gain  of 12 lbs in past year. Referred to see Dr. Jillene Bucks at [facility] for [facility] therapy. - Referral for sleep study for ?OSA  Bardet-Biedl  syndrome - Liver/Renal US scheduled for Dec 1 - PT/INR, PTT, CMP, CBC, FSH/LH, Estrogen, Testosterone, A1c today - Dr. Jillene Bucks for associated hyperphagia and weight gain as above - Follows with Cardiology. Next Echo Dec 16  PCOS (polycystic ovarian syndrome) Patient with worsening hirsutism and acne. No menses since starting Micronor (due to headaches with estrogen-containing OCPs). Never had a pelvic US, though BBS predisposes her to genitourinary abnormalities. Question whether her hyperandrogenism, obesity, and irregular menses are truly secondary to PCOS or if they represent manifestations of her BBS. - Pelvic US - FSH/LH, Estrogen, Testosterone as above  Healthcare maintenance - Flu shot today  Hypertension Persistent BP elevation to 140s/80s. Never on medication. Recently started on Lopressor 12.5mg  BID for sinus tachycardia but not taking this yet. - Reassess at follow-up after starting B blocker. Likely that she will need another agent for optimal control.      Pearla Dubonnet, MD [place]

## 2021-06-21 NOTE — Patient Instructions (Addendum)
Medication Instructions:  Your physician has recommended you make the following change in your medication:   START Metoprolol tartrate 25 mg and take one half tablet (12.5 mg) twice a day  *If you need a refill on your cardiac medications before your next appointment, please call your pharmacy*   Lab Work: None  If you have labs (blood work) drawn today and your tests are completely normal, you will receive your results only by: MyChart Message (if you have MyChart) OR A paper copy in the mail If you have any lab test that is abnormal or we need to change your treatment, we will call you to review the results.   Testing/Procedures: Your physician has requested that you have an echocardiogram.   Echocardiography is a painless test that uses sound waves to create images of your heart. It provides your doctor with information about the size and shape of your heart and how well your heart's chambers and valves are working. This procedure takes approximately one hour. There are no restrictions for this procedure.    Follow-Up: At [facility], you and your health needs are our priority.  As part of our continuing mission to provide you with exceptional heart care, we have created designated Provider Care Teams.  These Care Teams include your primary Cardiologist (physician) and Advanced Practice Providers (APPs -  Physician Assistants and Nurse Practitioners) who all work together to provide you with the care you need, when you need it.   Your next appointment:   1 month(s)  The format for your next appointment:   In Person  Provider:   Julien Nordmann, MD or Cadence Fransico Michael, [place]

## 2021-06-22 ENCOUNTER — Other Ambulatory Visit: Payer: Self-pay

## 2021-06-22 ENCOUNTER — Encounter: Payer: Self-pay | Admitting: Student

## 2021-06-22 ENCOUNTER — Ambulatory Visit (INDEPENDENT_AMBULATORY_CARE_PROVIDER_SITE_OTHER): Payer: Medicaid Other | Admitting: Student

## 2021-06-22 ENCOUNTER — Ambulatory Visit (INDEPENDENT_AMBULATORY_CARE_PROVIDER_SITE_OTHER): Payer: Medicaid Other | Admitting: Pediatric Genetics

## 2021-06-22 VITALS — BP 149/86 | HR 99 | Wt 325.0 lb

## 2021-06-22 DIAGNOSIS — Z23 Encounter for immunization: Secondary | ICD-10-CM

## 2021-06-22 DIAGNOSIS — Z0000 Encounter for general adult medical examination without abnormal findings: Secondary | ICD-10-CM | POA: Insufficient documentation

## 2021-06-22 DIAGNOSIS — G4719 Other hypersomnia: Secondary | ICD-10-CM

## 2021-06-22 DIAGNOSIS — E282 Polycystic ovarian syndrome: Secondary | ICD-10-CM | POA: Diagnosis not present

## 2021-06-22 DIAGNOSIS — Q8789 Other specified congenital malformation syndromes, not elsewhere classified: Secondary | ICD-10-CM

## 2021-06-22 DIAGNOSIS — I10 Essential (primary) hypertension: Secondary | ICD-10-CM | POA: Diagnosis not present

## 2021-06-22 DIAGNOSIS — Z68.41 Body mass index (BMI) pediatric, greater than or equal to 95th percentile for age: Secondary | ICD-10-CM | POA: Diagnosis not present

## 2021-06-22 LAB — POCT GLYCOSYLATED HEMOGLOBIN (HGB A1C): Hemoglobin A1C: 5.7 % — AB (ref 4.0–5.6)

## 2021-06-22 NOTE — Assessment & Plan Note (Signed)
Secondary to Bardet-Biedl. Weight gain of 12 lbs in past year. Referred to see Dr. Felipa Evener at [facility] for [facility] therapy. - Referral for sleep study for ?OSA

## 2021-06-22 NOTE — Assessment & Plan Note (Signed)
Patient with worsening hirsutism and acne. No menses since starting Micronor (due to headaches with estrogen-containing OCPs). Never had a pelvic US, though BBS predisposes her to genitourinary abnormalities. Question whether her hyperandrogenism, obesity, and irregular menses are truly secondary to PCOS or if they represent manifestations of her BBS. - Pelvic US - FSH/LH, Estrogen, Testosterone as above

## 2021-06-22 NOTE — Progress Notes (Signed)
Visit only for buccal swab for molecular testing to confirm BBS diagnosis in [place]. 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

## 2021-06-22 NOTE — Assessment & Plan Note (Signed)
Persistent BP elevation to 140s/80s. Never on medication. Recently started on Lopressor 12.5mg  BID for sinus tachycardia but not taking this yet. - Reassess at follow-up after starting B blocker. Likely that she will need another agent for optimal control.

## 2021-06-22 NOTE — Assessment & Plan Note (Addendum)
-   Liver/Renal US scheduled for Dec 1 - PT/INR, PTT, CMP, CBC, FSH/LH, Estrogen, Testosterone, A1c today - Dr. Felipa Evener for associated hyperphagia and weight gain as above - Follows with Cardiology. Next Echo Dec 16

## 2021-06-22 NOTE — Patient Instructions (Addendum)
Sunaina, It is such a joy to take care you! I am going to enjoy being your family doctor! Thank you for coming in today.   As a reminder, here is a recap of what we talked about today:  - Your A1c shows that you are in the pre-diabetic range. I am not going to make any medication changes now since you are headed to see Dr. Felipa Evener at [facility] who can help with this. The best thing you can do for now is to concentrate on the foods you are putting into your body and going to the gym with your mom.  - I am referring you to have a sleep study. They will reach out to you to set this up.  - We will be getting ultrasounds of your liver and kidneys - We are also going to get a pelvic ultrasound to evaluate for abnormalities. Continue taking your Micronor.  We are checking some labs today. I will call you if they are abnormal. I will send you a MyChart message or a letter if they are normal.  If you do not hear about your labs in the next 2 weeks please let us know.  I recommend that you always bring your medications to each appointment as this makes it easy to ensure we are on the correct medications and helps [place] not miss when refills are needed.  Take care and seek immediate care sooner if you develop any concerns.   Eliezer Mccoy, MD [facility]

## 2021-06-22 NOTE — Assessment & Plan Note (Signed)
Flu shot today 

## 2021-06-25 LAB — CBC
Hematocrit: 40.3 % (ref 34.0–46.6)
Hemoglobin: 13.6 g/dL (ref 11.1–15.9)
MCH: 29.7 pg (ref 26.6–33.0)
MCHC: 33.7 g/dL (ref 31.5–35.7)
MCV: 88 fL (ref 79–97)
Platelets: 400 10*3/uL (ref 150–450)
RBC: 4.58 x10E6/uL (ref 3.77–5.28)
RDW: 13.1 % (ref 11.7–15.4)
WBC: 9.3 10*3/uL (ref 3.4–10.8)

## 2021-06-25 LAB — TESTOSTERONE: Testosterone: 53 ng/dL (ref 13–71)

## 2021-06-25 LAB — PROTIME-INR
INR: 1 (ref 0.9–1.2)
Prothrombin Time: 10.3 s (ref 9.1–12.0)

## 2021-06-25 LAB — COMPREHENSIVE METABOLIC PANEL
ALT: 57 IU/L — ABNORMAL HIGH (ref 0–32)
AST: 44 IU/L — ABNORMAL HIGH (ref 0–40)
Albumin/Globulin Ratio: 2 (ref 1.2–2.2)
Albumin: 4.6 g/dL (ref 3.9–5.0)
Alkaline Phosphatase: 56 IU/L (ref 42–106)
BUN/Creatinine Ratio: 14 (ref 9–23)
BUN: 9 mg/dL (ref 6–20)
Bilirubin Total: 0.3 mg/dL (ref 0.0–1.2)
CO2: 23 mmol/L (ref 20–29)
Calcium: 9.7 mg/dL (ref 8.7–10.2)
Chloride: 105 mmol/L (ref 96–106)
Creatinine, Ser: 0.63 mg/dL (ref 0.57–1.00)
Globulin, Total: 2.3 g/dL (ref 1.5–4.5)
Glucose: 121 mg/dL — ABNORMAL HIGH (ref 70–99)
Potassium: 4.4 mmol/L (ref 3.5–5.2)
Sodium: 141 mmol/L (ref 134–144)
Total Protein: 6.9 g/dL (ref 6.0–8.5)
eGFR: 131 mL/min/{1.73_m2} (ref >59)

## 2021-06-25 LAB — FSH/LH
FSH: 4.4 m[IU]/mL
LH: 5.5 m[IU]/mL

## 2021-06-25 LAB — APTT: aPTT: 28 s (ref 24–33)

## 2021-06-25 LAB — ESTROGENS, TOTAL: Estrogen: 217 pg/mL

## 2021-06-26 ENCOUNTER — Telehealth: Payer: Self-pay | Admitting: Student

## 2021-06-26 NOTE — Telephone Encounter (Signed)
Attempted to call Marirose's mom with lab results with no answer. I will call back later.

## 2021-07-12 ENCOUNTER — Ambulatory Visit (HOSPITAL_COMMUNITY)
Admission: RE | Admit: 2021-07-12 | Discharge: 2021-07-12 | Disposition: A | Discharge status: Home / Self Care | Payer: Medicaid Other | Source: Ambulatory Visit | Attending: Family Medicine | Admitting: Family Medicine

## 2021-07-12 ENCOUNTER — Other Ambulatory Visit: Payer: Self-pay

## 2021-07-12 DIAGNOSIS — Z9049 Acquired absence of other specified parts of digestive tract: Secondary | ICD-10-CM | POA: Diagnosis not present

## 2021-07-12 DIAGNOSIS — Z419 Encounter for procedure for purposes other than remedying health state, unspecified: Secondary | ICD-10-CM | POA: Diagnosis not present

## 2021-07-12 DIAGNOSIS — Q8789 Other specified congenital malformation syndromes, not elsewhere classified: Secondary | ICD-10-CM | POA: Diagnosis not present

## 2021-07-12 DIAGNOSIS — K7689 Other specified diseases of liver: Secondary | ICD-10-CM | POA: Diagnosis not present

## 2021-07-13 ENCOUNTER — Encounter: Payer: Self-pay | Admitting: Student

## 2021-07-18 DIAGNOSIS — R7303 Prediabetes: Secondary | ICD-10-CM | POA: Diagnosis not present

## 2021-07-18 DIAGNOSIS — Q8789 Other specified congenital malformation syndromes, not elsewhere classified: Secondary | ICD-10-CM | POA: Diagnosis not present

## 2021-07-18 DIAGNOSIS — K760 Fatty (change of) liver, not elsewhere classified: Secondary | ICD-10-CM | POA: Diagnosis not present

## 2021-07-18 DIAGNOSIS — E782 Mixed hyperlipidemia: Secondary | ICD-10-CM | POA: Diagnosis not present

## 2021-07-19 ENCOUNTER — Encounter (INDEPENDENT_AMBULATORY_CARE_PROVIDER_SITE_OTHER): Payer: Self-pay | Admitting: Pediatric Genetics

## 2021-07-23 ENCOUNTER — Other Ambulatory Visit: Payer: Self-pay | Admitting: Student in an Organized Health Care Education/Training Program

## 2021-07-27 ENCOUNTER — Ambulatory Visit (INDEPENDENT_AMBULATORY_CARE_PROVIDER_SITE_OTHER): Payer: Medicaid Other

## 2021-07-27 ENCOUNTER — Other Ambulatory Visit: Payer: Self-pay

## 2021-07-27 DIAGNOSIS — R0600 Dyspnea, unspecified: Secondary | ICD-10-CM | POA: Diagnosis not present

## 2021-07-27 DIAGNOSIS — R000 Tachycardia, unspecified: Secondary | ICD-10-CM | POA: Diagnosis not present

## 2021-07-27 LAB — ECHOCARDIOGRAM COMPLETE
AR max vel: 3.43 cm2
AV Area VTI: 3.55 cm2
AV Area mean vel: 3.26 cm2
AV Mean grad: 5 mmHg
AV Peak grad: 8.2 mmHg
Ao pk vel: 1.43 m/s
Area-P 1/2: 4.26 cm2
S' Lateral: 3.4 cm
Single Plane A4C EF: 56.9 %

## 2021-07-29 ENCOUNTER — Other Ambulatory Visit: Payer: Self-pay | Admitting: Student

## 2021-07-29 NOTE — Progress Notes (Signed)
Cardiology Office Note  Date:  07/30/2021   ID:  Cassandra Faulkner, Cassandra Faulkner November 07, 2000, MRN [id number]  PCP:  [name], MD   Chief Complaint  Patient presents with   1 month follow up     Discuss Echo results. Patient c/o shortness of breath at times. Medications reviewed by the patient's mother Tanzania Basham).     HPI:  [name] is a 20 year old woman with past medical history of Bardet-Biedl syndrome  Retinitis pigmentosa with visual difficulties,S/P surgery for polydactyly.  Morbid obesity PCOS Who presents for f/u of her tachycardia, Bardet-Biedl syndrome   In follow-up today, reports feeling well Discussed recent imaging studies and monitor as detailed below Mother interested in trying a medication to curb her appetite given rapid weight gain over the past several years  Normal echo reviewed 07/27/2021 1. Left ventricular ejection fraction, by estimation, is 60 to 65%. The  left ventricle has normal function. The left ventricle has no regional  wall motion abnormalities. Left ventricular diastolic parameters were  normal.   2. Right ventricular systolic function is normal. The right ventricular  size is normal. Tricuspid regurgitation signal is inadequate for assessing  PA pressure.   3. The mitral valve is normal in structure. No evidence of mitral valve  regurgitation. No evidence of mitral stenosis.   4. The aortic valve is normal in structure. Aortic valve regurgitation is  not visualized. No aortic stenosis is present.   5. The inferior vena cava is normal in size with greater than 50%  respiratory variability, suggesting right atrial pressure of 3 mmHg.   ZIO monitor September 2022 reviewed Patient had a min HR of 44 bpm, max HR of 188 bpm, and avg HR of 93 bpm.  Predominant underlying rhythm was Sinus Rhythm.  Isolated SVEs were rare (<1.0%), and no SVE Couplets or SVE Triplets were present. Isolated VEs were rare (<1.0%), and no VE Couplets or VE  Triplets were present.  Patient triggered events (2) not associated with significant arrhythmia  EKG personally reviewed by myself on todays visit Normal sinus rhythm rate 93 bpm no significant ST-T wave changes  Other past medical history reviewed Previously followed by pediatric cardiology Last seen January 2022 Prior notes indicating  no evidence of cardiac disease.  BPs measured at home have been normal.  She she has been taking atorvastatin for cholesterol over 200  Also was started on 4 OCPs.  thyroid dysfunction, which has since improved  Echo done at outside facility shows normal biventricular size and systolic function    PMH:   has a past medical history of Bardet-Biedl syndrome and Chronic otitis media of both ears.  PSH:    Past Surgical History:  Procedure Laterality Date   GALLBLADDER SURGERY      Current Outpatient Medications  Medication Sig Dispense Refill   atorvastatin (LIPITOR) 20 MG tablet TAKE 1 TABLET BY MOUTH EVERY DAY 90 tablet 0   metoprolol tartrate (LOPRESSOR) 25 MG tablet Take 0.5 tablets (12.5 mg total) by mouth 2 (two) times daily. 45 tablet 3   Multiple Vitamins-Minerals (MULTIVITAMIN WITH MINERALS) tablet Take 1 tablet by mouth daily.     norethindrone (MICRONOR) 0.35 MG tablet TAKE 1 TABLET BY MOUTH EVERY DAY 84 tablet 3   vitamin E 400 UNIT capsule Take 400 Units by mouth daily.     No current facility-administered medications for this visit.    Allergies:   Patient has no known allergies.   Social History:  The patient  reports that she has never smoked. She has never been exposed to tobacco smoke. She has never used smokeless tobacco. She reports that she does not drink alcohol.   Family History:   family history includes Breast cancer in her mother; Chromosomal disorder in her sister; Diabetes in her father; Hypertension in her father.    Review of Systems: Review of Systems  Constitutional: Negative.   HENT: Negative.     Respiratory: Negative.    Cardiovascular: Negative.   Gastrointestinal: Negative.   Musculoskeletal: Negative.   Neurological: Negative.   Psychiatric/Behavioral: Negative.    All other systems reviewed and are negative.   PHYSICAL EXAM: VS:  BP 118/74 (BP Location: Left Arm, Patient Position: Sitting, Cuff Size: Large)    Pulse 93    Ht 5\' 7"  (1.702 m)    Wt (!) 328 lb (148.8 kg)    SpO2 99%    BMI 51.37 kg/m  , BMI Body mass index is 51.37 kg/m. Constitutional:  oriented to person, place, and time. No distress.  HENT:  Head: Grossly normal Eyes:  no discharge. No scleral icterus.  Neck: No JVD, no carotid bruits  Cardiovascular: Regular rate and rhythm, no murmurs appreciated Pulmonary/Chest: Clear to auscultation bilaterally, no wheezes or rails Abdominal: Soft.  no distension.  no tenderness.  Musculoskeletal: Normal range of motion Neurological:  normal muscle tone. Coordination normal. No atrophy Skin: Skin warm and dry Psychiatric: normal affect, pleasant   Recent Labs: 02/14/2021: TSH 3.380 06/22/2021: ALT 57; BUN 9; Creatinine, Ser 0.63; Hemoglobin 13.6; Platelets 400; Potassium 4.4; Sodium 141    Lipid Panel Lab Results  Component Value Date   CHOL 177 (H) 02/14/2021   HDL 33 (L) 02/14/2021   LDLCALC 102 02/14/2021   TRIG 246 (H) 02/14/2021      Wt Readings from Last 3 Encounters:  07/30/21 (!) 328 lb (148.8 kg) (>99 %, Z= 2.93)*  06/22/21 (!) 325 lb (147.4 kg) (>99 %, Z= 2.91)*  06/21/21 (!) 324 lb 6 oz (147.1 kg) (>99 %, Z= 2.90)*   * Growth percentiles are based on CDC (Girls, 2-20 Years) data.      ASSESSMENT AND PLAN:  Problem List Items Addressed This Visit       Cardiology Problems   Hyperlipidemia     Other   Bardet-Biedl syndrome - Primary   Tachycardia   Other Visit Diagnoses     Dyspnea, unspecified type         Sinus tachycardia Heart rate  very reasonable on ZIO monitor Higher with exertion, likely exacerbated by  deconditioning, morbid obesity  Normal echocardiogram No strong indication for metoprolol Mother would prefer no medications and to take metoprolol on an as-needed basis  Morbid obesity Discussed calorie restriction, Walking program Mother looking into other options, injectables (Invokana?)  Hyperlipidemia For now we will avoid medications, recommended lifestyle modification, low calories  Bardet-Biedl syndrome  No notable cardiac issues noted Long discussion concerning weight management, calorie restriction, exercise program    Total encounter time more than 25 minutes  Greater than 50% was spent in counseling and coordination of care with the patient    Signed, 13/10/22, M.D., Ph.D. [facility] [place], [place]

## 2021-07-30 ENCOUNTER — Other Ambulatory Visit: Payer: Self-pay

## 2021-07-30 ENCOUNTER — Ambulatory Visit (INDEPENDENT_AMBULATORY_CARE_PROVIDER_SITE_OTHER): Payer: Medicaid Other | Admitting: Cardiovascular Disease

## 2021-07-30 ENCOUNTER — Encounter: Payer: Self-pay | Admitting: Cardiovascular Disease

## 2021-07-30 VITALS — BP 118/74 | HR 93 | Ht 67.0 in | Wt 328.0 lb

## 2021-07-30 DIAGNOSIS — R0600 Dyspnea, unspecified: Secondary | ICD-10-CM | POA: Diagnosis not present

## 2021-07-30 DIAGNOSIS — E782 Mixed hyperlipidemia: Secondary | ICD-10-CM

## 2021-07-30 DIAGNOSIS — R000 Tachycardia, unspecified: Secondary | ICD-10-CM

## 2021-07-30 DIAGNOSIS — Q8789 Other specified congenital malformation syndromes, not elsewhere classified: Secondary | ICD-10-CM | POA: Diagnosis not present

## 2021-07-30 MED ORDER — METOPROLOL TARTRATE 25 MG PO TABS: 12.5000 mg | ORAL_TABLET | ORAL | 3 refills | Status: DC | PRN

## 2021-07-30 NOTE — Patient Instructions (Addendum)
Medication Instructions:  Please Chagne Metoprolol 12.5 mg only as needed  If you need a refill on your cardiac medications before your next appointment, please call your pharmacy.   Lab work: No new labs needed  Testing/Procedures: No new testing needed  Follow-Up: At [facility], you and your health needs are our priority.  As part of our continuing mission to provide you with exceptional heart care, we have created designated Provider Care Teams.  These Care Teams include your primary Cardiologist (physician) and Advanced Practice Providers (APPs -  Physician Assistants and Nurse Practitioners) who all work together to provide you with the care you need, when you need it.  You will need a follow up appointment in 12 months  Providers on your designated Care Team:   Nicolasa Ducking, NP Eula Listen, PA-C Cadence Fransico Michael, [place]  COVID-19 Vaccine Information can be found at: [email or web address] For questions related to vaccine distribution or appointments, please email [email or web address] or call [phone].

## 2021-08-12 DIAGNOSIS — Z419 Encounter for procedure for purposes other than remedying health state, unspecified: Secondary | ICD-10-CM | POA: Diagnosis not present

## 2021-08-16 DIAGNOSIS — H9011 Conductive hearing loss, unilateral, right ear, with unrestricted hearing on the contralateral side: Secondary | ICD-10-CM | POA: Diagnosis not present

## 2021-08-16 DIAGNOSIS — H7201 Central perforation of tympanic membrane, right ear: Secondary | ICD-10-CM | POA: Diagnosis not present

## 2021-08-20 DIAGNOSIS — Q8789 Other specified congenital malformation syndromes, not elsewhere classified: Secondary | ICD-10-CM | POA: Diagnosis not present

## 2021-09-12 DIAGNOSIS — Z419 Encounter for procedure for purposes other than remedying health state, unspecified: Secondary | ICD-10-CM | POA: Diagnosis not present

## 2021-10-03 DIAGNOSIS — R7989 Other specified abnormal findings of blood chemistry: Secondary | ICD-10-CM | POA: Diagnosis not present

## 2021-10-03 DIAGNOSIS — R7303 Prediabetes: Secondary | ICD-10-CM | POA: Diagnosis not present

## 2021-10-03 DIAGNOSIS — K760 Fatty (change of) liver, not elsewhere classified: Secondary | ICD-10-CM | POA: Diagnosis not present

## 2021-10-03 DIAGNOSIS — Q8789 Other specified congenital malformation syndromes, not elsewhere classified: Secondary | ICD-10-CM | POA: Diagnosis not present

## 2021-10-03 DIAGNOSIS — E782 Mixed hyperlipidemia: Secondary | ICD-10-CM | POA: Diagnosis not present

## 2021-10-06 ENCOUNTER — Encounter: Payer: Self-pay | Admitting: Student

## 2021-10-06 ENCOUNTER — Other Ambulatory Visit: Payer: Self-pay | Admitting: Student

## 2021-10-06 DIAGNOSIS — I10 Essential (primary) hypertension: Secondary | ICD-10-CM

## 2021-10-06 DIAGNOSIS — E282 Polycystic ovarian syndrome: Secondary | ICD-10-CM

## 2021-10-06 DIAGNOSIS — Z68.41 Body mass index (BMI) pediatric, greater than or equal to 95th percentile for age: Secondary | ICD-10-CM

## 2021-10-06 DIAGNOSIS — Q8789 Other specified congenital malformation syndromes, not elsewhere classified: Secondary | ICD-10-CM

## 2021-10-08 ENCOUNTER — Telehealth: Payer: Self-pay | Admitting: *Deleted

## 2021-10-08 MED ORDER — ATORVASTATIN CALCIUM 20 MG PO TABS: 20.0000 mg | ORAL_TABLET | Freq: Every day | ORAL | 3 refills | Status: DC

## 2021-10-08 MED ORDER — NORETHINDRONE 0.35 MG PO TABS: 1.0000 | ORAL_TABLET | Freq: Every day | ORAL | 3 refills | Status: DC

## 2021-10-08 NOTE — Telephone Encounter (Signed)
Attempted to schedule sleep study bu ti just got a voicemail. Left her info on the VM and asked if they would call and schedule her sleep stdy with mom. Cassandra Faulkner, CMA

## 2021-10-08 NOTE — Telephone Encounter (Signed)
-----   Message from [name], MD sent at 10/08/2021 12:20 PM EST ----- Regarding: Sleep Study Scheduling Hi team, could you please call the [facility] sleep center and help schedule a sleep study for [name]? Apparently they do things differently out there than they do at [facility]. Their phone number is: [phone] Thanks so, so much!  Fredric Mare

## 2021-10-10 DIAGNOSIS — Z419 Encounter for procedure for purposes other than remedying health state, unspecified: Secondary | ICD-10-CM | POA: Diagnosis not present

## 2021-10-23 NOTE — Progress Notes (Signed)
? ? ?  SUBJECTIVE:  ? ?CHIEF COMPLAINT / HPI:  ? ?Periorbital Skin Darkening: ?Patient was noted to have acanthosis at the nape of the neck at her last visit with me. This was attributed to her prediabetic status in the setting of Bardet-Biedl syndrome. However, her mother reports that the dark velvety skin has spread to the areas around her eyes as well and presents today for re-assessment.  She reports that this is skin darkening began before she started taking the Imcivree or Seasonale that were prescribed to her by Dr. Felipa Evener. She describes it as similar in shade to the acanthosis previously noted on her neck, armpit, back of hands, and between her breasts. There is no history of trauma to the eyes. Vision remains at her baseline. ? ?Sleep Apnea Symptoms: ?Ordered patient a sleep study during our last visit due to excessive daytime sleepiness and snoring.  Unfortunately, this has not happened yet.  Referral was placed to [facility] sleep [facility] due to patient living closer to [place] than to [place].  ? ?Bardet-Biedl Syndrome ?Obesity ?She has established care with Dr. Felipa Evener at [facility] for endocrinology.  He has been prescribing her Imcivree for weight loss. Together with this, she has also undertaken an exercise program and had great success with weight loss, down 10lbs since our last visit. She and her sister, who also has Bardet-Biedl Syndrome, have been playing basketball several times per week.  Dr. Felipa Evener has also started her on Seasonale to help with hyperandrogenism. This will replace the Mircronor that she had previously been on.  ? ?PERTINENT  PMH / PSH: Bardet-Biedl Syndrome  ? ?OBJECTIVE:  ? ?BP 130/66   Pulse 94   Ht 5\' 7"  (1.702 m)   Wt (!) 144.5 kg   SpO2 99%   BMI 49.88 kg/m?   ?Gen: Pleasant, cheerful ?Skin: Acanthosis to flexor surfaces of arms, back of neck, extensor surfaces of hand joints, hyperpigmentation around bilateral eyes.  Conjunctivae normal.  No bruising noted on close  inspection.  Area is nontender to palpation. ? ? ? ?ASSESSMENT/PLAN:  ? ?Periorbital hyperpigmentation ?Suspect that acanthosis nigricans is the most likely cause of her periorbital hyperpigmentation.  I am reassured that it is nonpruritic.  I am hopeful that as she gets tighter control of her obesity and prediabetes with the changes she has made in conjunction with Dr. that this will improve.  If her acanthosis in other areas improves but there is no improvement to the area around the eyes, will pursue further work-up or referral to dermatology. ? ?Bardet-Biedl syndrome ?Patient has lost 10 pounds since her last visit with me in December.  I attribute this to both the Imcivree and her exercise regimen.  I congratulated her on her success and encouraged her to continue with her regular activity with her sister. ?-Patient will be due for her annual lab surveillance in November ?-Instructed patient to call [place] sleep center to follow-up on previously placed referral.  If she has not heard back by Monday, she will reach out to me via MyChart message and I will repeat place referral to [facility] ?  ? ? ?[facility], MD ?[facility]  ?

## 2021-10-24 ENCOUNTER — Encounter: Payer: Self-pay | Admitting: Student

## 2021-10-24 ENCOUNTER — Ambulatory Visit (INDEPENDENT_AMBULATORY_CARE_PROVIDER_SITE_OTHER): Payer: Medicaid Other | Admitting: Student

## 2021-10-24 ENCOUNTER — Other Ambulatory Visit: Payer: Self-pay

## 2021-10-24 DIAGNOSIS — Q8789 Other specified congenital malformation syndromes, not elsewhere classified: Secondary | ICD-10-CM | POA: Diagnosis not present

## 2021-10-24 DIAGNOSIS — L818 Other specified disorders of pigmentation: Secondary | ICD-10-CM | POA: Diagnosis not present

## 2021-10-24 NOTE — Assessment & Plan Note (Signed)
Suspect that acanthosis nigricans is the most likely cause of her periorbital hyperpigmentation.  I am reassured that it is nonpruritic.  I am hopeful that as she gets tighter control of her obesity and prediabetes with the changes she has made in conjunction with Dr. Felipa Evener that this will improve.  If her acanthosis in other areas improves but there is no improvement to the area around the eyes, will pursue further work-up or referral to dermatology. ?

## 2021-10-24 NOTE — Patient Instructions (Signed)
Cassandra Faulkner, ?It is such a joy to take care you! Thank you for coming in today.  ? ?As a reminder, here is a recap of what we talked about today:  ?- I think that the darkening around your eyes is most likely something called acanthosis nigricans, the same thing that is causing your skin to darken on your hands, armpits, and between your breasts. This is related to your prediabetes and comes from your BBS, as we get your weight and blood sugars under better control, I expect this to get better. If your skin lightens in the other areas and NOT around your eyes, please come back and we will do a more thorough investigation. ?- Please call the [place] center. If you have not been able to get your sleep study scheduled by Monday, please send me a mychart message and we will try to schedule you at [facility].   ? ?I do not need to see you again until December for your annual visit unless something comes up. I will be checking in and reading Dr. Lucy Antigua notes.  ? ?Take care and seek immediate care sooner if you develop any concerns.  ? ?Marnee Guarneri, MD ?[place] ? ?

## 2021-10-24 NOTE — Assessment & Plan Note (Addendum)
Patient has lost 10 pounds since her last visit with me in December.  I attribute this to both the Imcivree and her exercise regimen.  I congratulated her on her success and encouraged her to continue with her regular activity with her sister. ?-Patient will be due for her annual lab surveillance in November ?-Instructed patient to call [place] [facility] to follow-up on previously placed referral.  If she has not heard back by Monday, she will reach out to me via MyChart message and I will repeat place referral to [facility] sleep center ?

## 2021-10-28 ENCOUNTER — Encounter: Payer: Self-pay | Admitting: Student

## 2021-11-10 DIAGNOSIS — Z419 Encounter for procedure for purposes other than remedying health state, unspecified: Secondary | ICD-10-CM | POA: Diagnosis not present

## 2021-11-29 ENCOUNTER — Ambulatory Visit (HOSPITAL_BASED_OUTPATIENT_CLINIC_OR_DEPARTMENT_OTHER): Payer: Medicaid Other | Attending: Internal Medicine | Admitting: Internal Medicine

## 2021-11-29 VITALS — Ht 67.0 in | Wt 316.0 lb

## 2021-11-29 DIAGNOSIS — G4719 Other hypersomnia: Secondary | ICD-10-CM | POA: Diagnosis not present

## 2021-11-29 DIAGNOSIS — Q8789 Other specified congenital malformation syndromes, not elsewhere classified: Secondary | ICD-10-CM | POA: Insufficient documentation

## 2021-12-09 DIAGNOSIS — Q8789 Other specified congenital malformation syndromes, not elsewhere classified: Secondary | ICD-10-CM | POA: Diagnosis not present

## 2021-12-09 NOTE — Procedures (Signed)
? ?  Patient Name: Cassandra Faulkner, Cassandra Faulkner ?Study Date: 11/29/2021 ?Gender: Female ?D.O.B: Sep 22, 2000 ?Age (years): 46 ?Referring Provider: [name] MD ?Height (inches): 67 ?Interpreting Physician: Jetty Duhamel MD, ABSM ?Weight (lbs): 316 ?RPSGT: Cherylann Parr ?BMI: 49 ?MRN: [id number] ?Neck Size: 15.00 ? ?CLINICAL INFORMATION ?Sleep Study Type: NPSG ?Indication for sleep study: Hypertension, Obesity, Witnesses Apnea / Gasping During Sleep ?Epworth Sleepiness Score: 8 ? ?SLEEP STUDY TECHNIQUE ?As per the AASM Manual for the Scoring of Sleep and Associated Events v2.3 (April 2016) with a hypopnea requiring 4% desaturations. ? ?The channels recorded and monitored were frontal, central and occipital EEG, electrooculogram (EOG), submentalis EMG (chin), nasal and oral airflow, thoracic and abdominal wall motion, anterior tibialis EMG, snore microphone, electrocardiogram, and pulse oximetry. ? ?MEDICATIONS ?Medications self-administered by patient taken the night of the study : MELATONIN ? ?SLEEP ARCHITECTURE ?The study was initiated at 9:58:05 PM and ended at 4:40:45 AM. ? ?Sleep onset time was 33.1 minutes and the sleep efficiency was 32.0%%. The total sleep time was 129 minutes. ? ?Stage REM latency was N/A minutes. ? ?The patient spent 4.7%% of the night in stage N1 sleep, 72.5%% in stage N2 sleep, 22.9%% in stage N3 and 0% in REM. ? ?Alpha intrusion was absent. ? ?Supine sleep was 44.96%. ? ?RESPIRATORY PARAMETERS ?The overall apnea/hypopnea index (AHI) was 0.0 per hour. There were 0 total apneas, including 0 obstructive, 0 central and 0 mixed apneas. There were 0 hypopneas and 0 RERAs. ? ?The AHI during Stage REM sleep was N/A per hour. ? ?AHI while supine was 0.0 per hour. ? ?The mean oxygen saturation was 96.4%. The minimum SpO2 during sleep was 94.0%. ? ?snoring was noted during this study. ? ?CARDIAC DATA ?The 2 lead EKG demonstrated sinus rhythm. The mean heart rate was 79.9 beats per minute.  ? ?LEG MOVEMENT  DATA ?The total PLMS were 0 with a resulting PLMS index of 0.0. Associated arousal with leg movement index was 0.0 . ? ?IMPRESSIONS ?- No significant obstructive sleep apnea occurred during this study (AHI = 0.0/h). ?- The patient had minimal or no oxygen desaturation during the study (Min O2 = 94.0%) ?- Difficulty initiating and maintaining sleep. Awake after 02:40 AM. ?- No snoring was audible during this study. ?- EKG findings include Sinus Rhythm. ?- Clinically significant periodic limb movements did not occur during sleep. No significant associated arousals. ? ?DIAGNOSIS ?- Normal study    Possible Insomnia ? ?RECOMMENDATIONS ?- Manage for symptoms based on clinical judgment. ?- Sleep hygiene should be reviewed to assess factors that may improve sleep quality. ?- Weight management and regular exercise should be initiated or continued if appropriate. ? ?[Electronically signed] 12/09/2021 12:07 PM ? ?Jetty Duhamel MD, ABSM ?Diplomate, Biomedical engineer of Sleep Medicine ?NPI: [id number] ? ? ?  ? ? ? ? ? ? ? ? ? ? ? ? ? ? ? ? ? ? ? ? ? ?Takyah Ciaramitaro ?Diplomate, Biomedical engineer of Sleep Medicine ? ?ELECTRONICALLY SIGNED ON:  12/09/2021, 12:03 PM ?[facility] ?PH: [phone]   FX: [phone] ?ACCREDITED BY THE AMERICAN ACADEMY OF SLEEP MEDICINE ?

## 2021-12-11 ENCOUNTER — Encounter: Payer: Self-pay | Admitting: Student

## 2021-12-11 DIAGNOSIS — G4701 Insomnia due to medical condition: Secondary | ICD-10-CM

## 2021-12-12 MED ORDER — DOXEPIN HCL 10 MG/ML PO CONC: 6.0000 mg | Freq: Every day | ORAL | 0 refills | Status: DC

## 2022-01-02 DIAGNOSIS — K760 Fatty (change of) liver, not elsewhere classified: Secondary | ICD-10-CM | POA: Diagnosis not present

## 2022-01-02 DIAGNOSIS — Z79899 Other long term (current) drug therapy: Secondary | ICD-10-CM | POA: Diagnosis not present

## 2022-01-02 DIAGNOSIS — R7989 Other specified abnormal findings of blood chemistry: Secondary | ICD-10-CM | POA: Diagnosis not present

## 2022-01-02 DIAGNOSIS — I10 Essential (primary) hypertension: Secondary | ICD-10-CM | POA: Diagnosis not present

## 2022-01-02 DIAGNOSIS — N2889 Other specified disorders of kidney and ureter: Secondary | ICD-10-CM | POA: Diagnosis not present

## 2022-01-02 DIAGNOSIS — R7303 Prediabetes: Secondary | ICD-10-CM | POA: Diagnosis not present

## 2022-01-02 DIAGNOSIS — Q8789 Other specified congenital malformation syndromes, not elsewhere classified: Secondary | ICD-10-CM | POA: Diagnosis not present

## 2022-01-02 DIAGNOSIS — I151 Hypertension secondary to other renal disorders: Secondary | ICD-10-CM | POA: Diagnosis not present

## 2022-01-02 DIAGNOSIS — E782 Mixed hyperlipidemia: Secondary | ICD-10-CM | POA: Diagnosis not present

## 2022-01-05 ENCOUNTER — Other Ambulatory Visit: Payer: Self-pay | Admitting: Student

## 2022-01-05 DIAGNOSIS — G4701 Insomnia due to medical condition: Secondary | ICD-10-CM

## 2022-01-06 ENCOUNTER — Encounter: Payer: Self-pay | Admitting: Student

## 2022-01-08 ENCOUNTER — Other Ambulatory Visit: Payer: Self-pay | Admitting: Student

## 2022-01-08 ENCOUNTER — Telehealth: Payer: Self-pay

## 2022-01-08 DIAGNOSIS — Q8789 Other specified congenital malformation syndromes, not elsewhere classified: Secondary | ICD-10-CM

## 2022-01-08 DIAGNOSIS — G4701 Insomnia due to medical condition: Secondary | ICD-10-CM

## 2022-01-08 MED ORDER — DOXEPIN HCL 6 MG PO TABS: 6.0000 mg | ORAL_TABLET | Freq: Every day | ORAL | 5 refills | Status: DC

## 2022-01-08 NOTE — Telephone Encounter (Signed)
A Prior Authorization was initiated for this patients DOXEPIN TABLETS through CoverMyMeds.   Key: [id number]

## 2022-01-09 ENCOUNTER — Encounter (INDEPENDENT_AMBULATORY_CARE_PROVIDER_SITE_OTHER): Payer: Self-pay | Admitting: Pediatric Genetics

## 2022-01-09 NOTE — Telephone Encounter (Signed)
Prior Auth for patients medication DOXEPIN denied by [facility] MEDICAID via CoverMyMeds.   Reason: The drug that you asked for is not covered. We are denying your request because we do not show that you have tried at least 2 preferred drugs. Covered drug(s) is/are: Flurazepam, Temazepam, Zolpidem.  For requests of 15 units per 30 days or more there are additional clinical requirements (please see denial letter scanned to chart).  CoverMyMeds Key: [id number]

## 2022-01-12 ENCOUNTER — Encounter: Payer: Self-pay | Admitting: Student

## 2022-01-15 ENCOUNTER — Encounter: Payer: Self-pay | Admitting: *Deleted

## 2022-01-18 ENCOUNTER — Encounter: Payer: Self-pay | Admitting: Student

## 2022-01-18 DIAGNOSIS — G4701 Insomnia due to medical condition: Secondary | ICD-10-CM

## 2022-01-18 DIAGNOSIS — Q8789 Other specified congenital malformation syndromes, not elsewhere classified: Secondary | ICD-10-CM

## 2022-01-21 MED ORDER — DOXEPIN HCL 10 MG/ML PO CONC: 6.0000 mg | Freq: Every day | ORAL | 0 refills | Status: DC

## 2022-02-04 NOTE — Progress Notes (Signed)
MEDICAL GENETICS FOLLOW-UP VISIT  Patient name: Cassandra Faulkner DOB: [birth year] Age: 21 y.o. MRN: [id number]  Initial Referring Provider/Specialty: Levert Feinstein, MD / Family Medicine Date of Evaluation: 02/07/2022 Chief Complaint/Reason for Referral: Marlane [facility] syndrome  HPI: Cassandra Faulkner is a 21 y.o. female who presents today for follow-up with Genetics pertaining to her diagnosis of Bardet Biedl syndrome (BBS). She is accompanied by her mother and sister (also with BBS) at today's visit.  To review, their initial visit with [place] in Genetics was on 05/10/2021 with follow-up on 06/22/2021.  Since that visit, Cassandra Faulkner has been working with her PCP Dr. Marisue Humble for surveillance management related to BBS: Pelvic ultrasound (07/2021)- normal. Abdominal ultrasound (07/2021)- non alcoholic fatty liver.  Cardiology- Dr. Mariah Milling, [facility] [place] (07/2021) Echocardiogram normal. Sinus tachycardia- ZIO monitor in 04/2021 Follow up in 12 months Ophthalmology- trying to get back in with [facility] [place] [facility] reports no change in vision since we last saw her Sleep study 07/2021- no sleep apnea. Did show insomnia. Started on doxepin, which has been helping. Endocrinology- Dr. Felipa Evener, [facility] On setmelanotide (Imcivree) since January 2023 Has lost 24 lbs since our last visit. Appetite decreased, not wanting as large portions or seconds, less snacking. On seasonale for hyperandrogenism Recent increase in blood pressure. Was on lisinopril but then blood pressure dropped so stopped. Blood pressure normal since. Last saw 12/2021- follow up 06/12/2022. Annual labs Hepatic enzymes- elevated LFTs (AST- 52, ALT 113) (12/2021) PT- wnl (06/2021) PTT- wnl (06/2021) CBC- wnl (06/2021) Serum electrolytes- wnl (06/2021)  Creatinine- wnl (06/2021) BUN- wnl (06/2021) Cystatin C- has not been performed Lipid panel- elevated LDL (107) and triglycerides (221)  (12/2021) Fasting blood glucose- elevated 121 (06/2021) HgbA1c- 5.2, wnl (12/2021) Thyroid gland function- TSH, T4, T3 wnl (02/2021) FSH/LH- wnl (06/2021) Estrogen- normal (06/2021) Testosterone- normal (06/2021)  Cassandra Faulkner is currently home with mom, not working but expressed interest in this, particularly with helping take care of animals. Mom expressed concerns regarding Cassandra Faulkner's future/independence.  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/10/2020   PCOS (polycystic ovarian syndrome) 04/10/2020   Verruca vulgaris 03/27/2020   Secondary amenorrhea 03/10/2020   Retinitis pigmentosa 06/24/2016   Developmental delay 06/24/2016   Hypertension 05/27/2014   Obesity 11/18/2007   Bardet-Biedl syndrome 02/19/2007    Past Surgical History:  Past Surgical History:  Procedure Laterality Date   GALLBLADDER SURGERY      Social History: Social History   Social History Narrative   Lives with mother, father, and sister    Medications: Current Outpatient Medications on File Prior to Visit  Medication Sig Dispense Refill   atorvastatin (LIPITOR) 20 MG tablet Take 1 tablet (20 mg total) by mouth daily. 90 tablet 3   doxepin (SINEQUAN) 10 MG/ML solution Take 0.6 mLs (6 mg total) by mouth at bedtime. 118 mL 0   levonorgestrel-ethinyl estradiol (SEASONALE) 0.15-0.03 MG tablet Take 1 tablet by mouth daily.     Multiple Vitamins-Minerals (MULTIVITAMIN WITH MINERALS) tablet Take 1 tablet by mouth daily.     vitamin E 400 UNIT capsule Take 400 Units by mouth daily.     No current facility-administered medications  on file prior to visit.    Allergies:   No Known Allergies  Immunizations: Up to date  Review of Systems (updates in bold): General: Bardet-Biedl syndrome. Generally doing well- happy and healthy. Interested in more independence and getting a job. Overweight but improving. Eyes/vision: Nyctalopia and photophobia. Sees retinal specialist yearly -- due. Wears glasses. Ears/hearing: no concerns. Dental: no concerns. Respiratory: no concerns. Cardiovascular: tachycardia- thought to be benign. Normal echocardiogram. Following with Cardiology. Gastrointestinal: h/o gallstones s/p cholecystectomy. Elevated AST, ALT. NAFL on abd [place]. Genitourinary: Normal renal ultrasound in the past. Has not seen gynecologist or had pap smear but working on this with PCP. Normal pelvic US. Endocrine: Irregular periods. On OCPs. Possible PCOS. Slightly abnormal thyroid studies in the past- no medication needed. Following with Dr. Felipa Evener at [facility] for Imcivree and pleased with medication and weight/appetite improvements. Hematologic: no concerns. Immunologic: no concerns. Neurological: developmental delays. No seizures. Psychiatric: working on activities of daily living and becoming more independent. 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 Skin, Hair, Nails: no concerns.  Family History: No updates to family history since last visit  Physical Examination: Weight: 136.9 kg (99.79%) Height: 5'7.8" (91.5%) Head circumference: 59.5 cm (99%)  Ht 5' 7.8" (1.722 m)   Wt (!) 301 lb 12.8 oz (136.9 kg)   HC 59.5 cm (23.43")   BMI 46.17 kg/m   General: Alert, engaging in conversation, obese but improved from last visit in November 2022 Head: Normocephalic Eyes: Normoset, Normal lids, lashes, brows, wearing glasses Nose: Normal appearance Lips/Mouth/Teeth: Normal appearance Ears: Normoset and normally formed, no pits, tags or creases Neck: Normal appearance Heart: Warm and well perfused Lungs: No  increased work of breathing Neurologic: Normal gross motor by observation, no abnormal movements Psych: Pleasant demeanor, responses to questions have immaturity compared to chronological age  Updated Genetic testing: BBS1 ([employer]): 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  Cassandra Faulkner and her sister return to genetics clinic for management and resource follow up. Since their last visit, they have found a strong care team and have worked diligently with their PCP Dr. Marisue Humble and [facility] Dr. Felipa Evener to follow surveillance recommendations. Cassandra Faulkner has had good success with weight loss following treatment with setmelanotide in combination with better eating habits and more exercise. We praised these life changes and encouraged her to continue working closely with her doctors in this regard. We are impressed with the family's efforts.  From a surveillance standpoint, Cassandra Faulkner is largely up to date. We do recommend the following: Thyroid labs at next PCP visit (last checked July 2022) Other annual labs- last checked in either November 2022 or May 2023, continue to follow annually Cystatin C- has not been previously checked before, would recommend at next PCP visit Annual abdominal imaging- due in December 2023 Ophthalmology- should occur annually. Mother reports they are in the process of connecting back with [facility]. Continue following with other providers as directed (Dr. Marisue Humble, Dr. Felipa Evener, Dr. Mariah Milling)  We had a long discussion today about plans for the future and independent living for Cassandra Faulkner and her sister. Mother voiced worries about what will happen when she and their father pass. We discussed the importance of having these  conversations and making plans early. We encouraged the family to reach out  to [employer] and the Department of Health and [employer] as these organizations have many resources for families and individuals navigating these questions. Additional resources for independent living were provided (including the [employer]). Finally, Cassandra Faulkner and her sister have voiced a desire for more social interactions in the past. Information regarding [employer] and [employer] was shared with the family, as this organization hosts many events and sports leagues for individuals with disabilities.  Of note, the Bardet Biedl Syndrome Foundation hosts regional conferences throughout the year. On March 09, 2022 there will be a conference in [place], [place]. Registration for the conference is free of charge. We encouraged the family to attend the conference if able to learn about updates regarding BBS as well as to meet other individuals/families with BBS.  Recommendations: Thyroid labs at next PCP visit (last checked July 2022) Other annual labs- last checked in either November 2022 or May 2023, continue to follow annually Cystatin C- has not been previously checked before, would recommend at next PCP visit Annual abdominal imaging- due in December 2023 Ophthalmology- should occur annually. Mother reports they are in the process of connecting back with [facility]. Continue following with other providers as directed (Dr. Marisue Humble, Dr. Felipa Evener, Dr. Mariah Milling)  Follow-up in 1 year for routine BBS surveillance (~June 2024).   Charline Bills, MS, [facility] Certified Genetic Counselor  Loletha Grayer, D.O. Attending Physician Medical Genetics Date: 02/18/2022 Time: 11:01am  Total time spent: 60 minutes Time spent includes face to face and non-face to face care for the patient on the date of this encounter (history and physical, genetic counseling, coordination of care, data  gathering and/or documentation as outlined)

## 2022-02-05 ENCOUNTER — Other Ambulatory Visit: Payer: Self-pay | Admitting: Student

## 2022-02-05 DIAGNOSIS — G4701 Insomnia due to medical condition: Secondary | ICD-10-CM

## 2022-02-07 ENCOUNTER — Encounter (INDEPENDENT_AMBULATORY_CARE_PROVIDER_SITE_OTHER): Payer: Self-pay | Admitting: Pediatric Genetics

## 2022-02-07 ENCOUNTER — Ambulatory Visit (INDEPENDENT_AMBULATORY_CARE_PROVIDER_SITE_OTHER): Payer: Medicaid Other | Admitting: Pediatric Genetics

## 2022-02-07 VITALS — Ht 67.8 in | Wt 301.8 lb

## 2022-02-07 DIAGNOSIS — Q8789 Other specified congenital malformation syndromes, not elsewhere classified: Secondary | ICD-10-CM

## 2022-02-18 NOTE — Patient Instructions (Signed)
At [facility], we are committed to providing exceptional care. You will receive a patient satisfaction survey through text or email regarding your visit today. Your opinion is important to me. Comments are appreciated.  

## 2022-02-26 ENCOUNTER — Encounter (INDEPENDENT_AMBULATORY_CARE_PROVIDER_SITE_OTHER): Payer: Self-pay | Admitting: Pediatric Genetics

## 2022-04-23 ENCOUNTER — Other Ambulatory Visit: Payer: Self-pay | Admitting: Student

## 2022-04-23 DIAGNOSIS — G4701 Insomnia due to medical condition: Secondary | ICD-10-CM

## 2022-06-13 ENCOUNTER — Encounter: Payer: Medicare HMO | Admitting: Student

## 2022-06-25 NOTE — Progress Notes (Unsigned)
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 Erik Obey as infants. 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 These extra digits were surgically removed shortly after birth. She has had normal renal ultrasounds in the past and does not have any blood pressure concerns. She has a history of gallstones at 17 and underwent cholecystectomy. Mother states liver labs have been normal so she has not had an abdominal ultrasound since that time. She established with cardiology in 2022 due to tachycardia. EKG and echocardiogram in July were normal. ZIO monitor was reassuring. Tachycardia thought to be exertional in the setting of obesity/deconditioning.    Cassandra Faulkner is overweight- however she established care with endocrinologist Dr. Felipa Evener at [facility] about a year ago and was started on Imcivree and has lost 25-30lbs. Beyond that, she has noticed a smaller waist circumference. Her primary side effect with the Imcivree has been diffuse skin hyperpigmentation. She does have some hyperpigmentation at the nape of the neck consistent with acanthosis nigricans. Had a slightly elevated A1c to 5.7% last year. Has been trying to make healthy food choices and exercising by dancing. She has evidence of hepatic steatosis on her most recent abdominal ultrasound (2022). She has had a sleep study in April of 2023 that was normal. She has had thyroid studies that were slightly abnormal in the past but she has not required medication. 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 She had a normal pelvic US in 12/22. She has expressed interest in having children in the future.    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 Mother states her vision loss has not progressed as quickly as her sister's. She follows with a retinal specialist yearly. Developmentally, Cassandra Faulkner was  delayed in meeting milestones. She was in resource classes in school and recently graduated high school. She is interested in getting a job and would like to work with animals. Her mom hopes that she will be able to get a job at the veterinarian's office where her mom is working. She does have a desire to be more independent, social, and have a boyfriend. She and her sister would like to move into a house together.   Bardet-Biedl syndrome (BBS) is a genetic condition that affects many body systems. Symptoms may vary between individuals even within the same family. Some of the major features include extra fingers and toes (polydactyly), obesity related to hyperphagia, developmental delays and learning differences, dental abnormalities, loss of sense of smell, hypogonadotropic hypogonadism, genitourinary abnormalities, renal and liver disease, cardiac abnormalities, and vision concerns. Vision abnormalities typically begin with night blindness in mid childhood, followed by loss of peripheral vision and poor visual acuity.     The following surveillance is recommended in those with BBS per [employer]: At each visit: Measure height, weight, head and waist circumference Assess daily physical activity level and dietary intake Measure blood pressure Assess for symptoms of scoliosis, sleep apnea, recurrent infection, IBD, celiac disease, neurogenic bladder, and bladder outflow obstruction. Sleep study normal earlier this year.  Every 6 months Routine dental care. Yes.  Annually Ophthalmology- to include visual field testing, electroretinography, and assessment for cataracts. Yes- [employer].  Liver and renal ultrasound Labs- hepatic enzymes, PT, PTT, CBC, serum electrolytes, creatinine, BUN, cystatin C, lipid panel, fasting blood glucose, HgbA1c, thyroid gland function, FSH, LH, estrogen, testosterone.  Other Initial  echocardiogram to assess for cardiac defects and cardiomyopathy; Complete  abdominal ultrasound to assess for laterality defects If normal, further evaluation only needed if cardiac symptoms develop Initial pelvic ultrasound in females to assess for structural differences. Done last year--normal Consider brain MRI if neurological abnormalities Consider neuropsychiatric evaluation is signs/symptoms of atypical behaviors or mood disorder Consider skeletal survey if signs of scoliosis or joint disease  Elevated BP without diagnosis of HTN Previously had a similarly elevated BP at the endocrinologist's office. She was started on lisinopril 10mg  daily but then mom found BP quite low (diastolics to the 40s) when checking it at home. She stopped the lisinopril and was persistently normotensive at home. She reports a fair amount of anxiety around being at the doctor and around the measurement of BP itself. High suspicion for white coat hypertension.     OBJECTIVE:   BP (!) 161/98   Pulse 99   Wt (!) 306 lb 9.6 oz (139.1 kg)   SpO2 100%   BMI 46.90 kg/m   Gen: Engaged, in good spirits, joking and smiling HENT: MMM, EOMs intact, acanthosis to nape of neck. Scant hirsuitism to sideburn area.  Cardio: Regular rate and rhythm, no murmurs, rubs, or gallops Pulm: normal WOB on RA, clear lung fields throughout Abd: Non-tender and non-distended Neuro: Gait normal Skin: Acanthosis to nape of neck, elbows, extensor surfaces of finger joints  ASSESSMENT/PLAN:   Bardet-Biedl syndrome Encouraged by weight loss and, even more so, by reported reduction in waist circumference.  - Annual labs today - Annual ultrasound next Monday - Continue engagement with ophtho, endocrine, genetics, and cardiology - Encouraged mom to take Ruchama to Bardet-Biedl Syndrome Foundation regional meeting next summer to engage with families who are facing similar challenges - Hope she can continue to find outlets for her energy. Glad she is dancing for exercise. Hope she can begin working soon.    Prediabetes A1c 5.7% last year. Hope this is improved with 25lbs weight loss and increased physical activity. However, diffuse acanthosis suggests a degree of insulin resistance.  - Repeat A1c today - Engaged with endocrinology  Elevated blood-pressure reading without diagnosis of hypertension Discordant data. Elevated BP at endocrine office and here but reported normal at home. - Will set up for 24 hr ambulatory monitoring     [name], MD [facility]

## 2022-06-27 ENCOUNTER — Ambulatory Visit (INDEPENDENT_AMBULATORY_CARE_PROVIDER_SITE_OTHER): Payer: Medicare HMO | Admitting: Student

## 2022-06-27 ENCOUNTER — Encounter: Payer: Self-pay | Admitting: Student

## 2022-06-27 ENCOUNTER — Other Ambulatory Visit: Payer: Self-pay

## 2022-06-27 VITALS — BP 161/98 | HR 99 | Wt 306.6 lb

## 2022-06-27 DIAGNOSIS — R030 Elevated blood-pressure reading, without diagnosis of hypertension: Secondary | ICD-10-CM

## 2022-06-27 DIAGNOSIS — R7303 Prediabetes: Secondary | ICD-10-CM

## 2022-06-27 DIAGNOSIS — Q8783 Bardet-Biedl syndrome: Secondary | ICD-10-CM | POA: Diagnosis not present

## 2022-06-27 NOTE — Assessment & Plan Note (Signed)
Encouraged by weight loss and, even more so, by reported reduction in waist circumference.  - Annual labs today - Annual ultrasound next Monday - Continue engagement with ophtho, endocrine, genetics, and cardiology - Encouraged mom to take Virlee to Bardet-Biedl Syndrome Foundation regional meeting next summer to engage with families who are facing similar challenges - Hope she can continue to find outlets for her energy. Glad she is dancing for exercise. Hope she can begin working soon.

## 2022-06-27 NOTE — Assessment & Plan Note (Signed)
Discordant data. Elevated BP at endocrine office and here but reported normal at home. - Will set up for 24 hr ambulatory monitoring

## 2022-06-27 NOTE — Assessment & Plan Note (Signed)
A1c 5.7% last year. Hope this is improved with 25lbs weight loss and increased physical activity. However, diffuse acanthosis suggests a degree of insulin resistance.  - Repeat A1c today - Engaged with endocrinology

## 2022-06-27 NOTE — Patient Instructions (Signed)
[name],  Your BP is up today, but based on the history from your mom, it may be that you have some "white coat" hypertension. We'll have you come see Dr. Raymondo Band our pharmacist to wear a BP cuff for 24 hours. This will tell us what your blood pressure "really" is.  Dorothyann Gibbs, MD

## 2022-06-29 LAB — LIPID PANEL
Chol/HDL Ratio: 3.5 ratio (ref 0.0–4.4)
Cholesterol, Total: 156 mg/dL (ref 100–199)
HDL: 44 mg/dL (ref >39)
LDL Chol Calc (NIH): 89 mg/dL (ref 0–99)
Triglycerides: 129 mg/dL (ref 0–149)
VLDL Cholesterol Cal: 23 mg/dL (ref 5–40)

## 2022-06-29 LAB — CBC
Hematocrit: 39.1 % (ref 34.0–46.6)
Hemoglobin: 12.8 g/dL (ref 11.1–15.9)
MCH: 28.8 pg (ref 26.6–33.0)
MCHC: 32.7 g/dL (ref 31.5–35.7)
MCV: 88 fL (ref 79–97)
Platelets: 440 10*3/uL (ref 150–450)
RBC: 4.44 x10E6/uL (ref 3.77–5.28)
RDW: 13.5 % (ref 11.7–15.4)
WBC: 8.2 10*3/uL (ref 3.4–10.8)

## 2022-06-29 LAB — BASIC METABOLIC PANEL
BUN/Creatinine Ratio: 16 (ref 9–23)
BUN: 9 mg/dL (ref 6–20)
CO2: 21 mmol/L (ref 20–29)
Calcium: 9.6 mg/dL (ref 8.7–10.2)
Chloride: 103 mmol/L (ref 96–106)
Creatinine, Ser: 0.57 mg/dL (ref 0.57–1.00)
Glucose: 99 mg/dL (ref 70–99)
Potassium: 4.5 mmol/L (ref 3.5–5.2)
Sodium: 139 mmol/L (ref 134–144)
eGFR: 133 mL/min/{1.73_m2} (ref >59)

## 2022-06-29 LAB — PROTIME-INR
INR: 0.9 (ref 0.9–1.2)
Prothrombin Time: 10.1 s (ref 9.1–12.0)

## 2022-06-29 LAB — HEPATIC FUNCTION PANEL
ALT: 31 IU/L (ref 0–32)
AST: 19 IU/L (ref 0–40)
Albumin: 4.3 g/dL (ref 4.0–5.0)
Alkaline Phosphatase: 40 IU/L — ABNORMAL LOW (ref 42–106)
Bilirubin Total: <0.2 mg/dL (ref 0.0–1.2)
Bilirubin, Direct: <0.1 mg/dL (ref 0.00–0.40)
Total Protein: 6.9 g/dL (ref 6.0–8.5)

## 2022-06-29 LAB — HEMOGLOBIN A1C
Est. average glucose Bld gHb Est-mCnc: 111 mg/dL
Hgb A1c MFr Bld: 5.5 % (ref 4.8–5.6)

## 2022-06-29 LAB — FSH/LH
FSH: <0.3 m[IU]/mL
LH: <0.3 m[IU]/mL

## 2022-06-29 LAB — ESTROGENS, TOTAL: Estrogen: 122 pg/mL

## 2022-06-29 LAB — CYSTATIN C: CYSTATIN C: 0.69 mg/L (ref 0.60–1.00)

## 2022-06-29 LAB — T4F: T4,Free (Direct): 1.01 ng/dL (ref 0.82–1.77)

## 2022-06-29 LAB — TSH RFX ON ABNORMAL TO FREE T4: TSH: 4.51 u[IU]/mL — ABNORMAL HIGH (ref 0.450–4.500)

## 2022-06-29 LAB — TESTOSTERONE: Testosterone: 25 ng/dL (ref 13–71)

## 2022-06-29 LAB — APTT: aPTT: 29 s (ref 24–33)

## 2022-07-01 ENCOUNTER — Ambulatory Visit (HOSPITAL_COMMUNITY): Payer: Medicare HMO

## 2022-07-09 ENCOUNTER — Ambulatory Visit: Payer: Medicare HMO | Admitting: Pharmacist

## 2022-07-10 ENCOUNTER — Ambulatory Visit (INDEPENDENT_AMBULATORY_CARE_PROVIDER_SITE_OTHER): Payer: Medicare HMO | Admitting: Pharmacist

## 2022-07-10 VITALS — Wt 304.4 lb

## 2022-07-10 DIAGNOSIS — R030 Elevated blood-pressure reading, without diagnosis of hypertension: Secondary | ICD-10-CM | POA: Diagnosis not present

## 2022-07-10 NOTE — Progress Notes (Unsigned)
S:     Chief Complaint  Patient presents with   Medication Management    Amb BP Monitor   21 y.o. female who presents for hypertension evaluation, education, and management.  PMH is significant for Bardet-Biedl Syndrome and obesity. Accompanied by her mother.  Patient was referred and last seen by Primary Care Provider, Dr. Marisue Humble, on 06/27/22.   Recently, the patient had elevated pressures in the office and was started on lisinopril 10 mg. The patients mom took the patient's pressures at home and the patient's blood pressure was consistently low (diastolics in the 40s). The lisinopril was discontinued and the patient's home blood pressure returned to normal. The patient continues to have elevated pressures in the office.  Discussed procedure for wearing the monitor and gave patient written instructions. Monitor was placed on non-dominant arm with instructions to return in the morning.   Current BP Medications include:  None  Antihypertensives tried in the past include: lisinopril 10 mg  Day #2 - Patient returns to clinic and reports no issues wearing the BP monitor. Pt returned blood pressure activity diary and noted walking at 2 PM, 4:30PM, 6:20PM, and 10:45 PM. Went to bed at 12:30AM and woke up at 8:30AM.  Patient reports they were able to wear the Ambulatory Blood Pressure Cuff for the entire 24 evaluation period.   O:  Review of Systems  All other systems reviewed and are negative.   Physical Exam Constitutional:      Appearance: Normal appearance. She is obese.  Pulmonary:     Effort: Pulmonary effort is normal.  Neurological:     Mental Status: She is alert.  Psychiatric:        Mood and Affect: Mood normal.        Thought Content: Thought content normal.     Last 3 Office BP readings: BP Readings from Last 3 Encounters:  06/27/22 (!) 161/98  10/24/21 130/66  07/30/21 118/74    Basic Metabolic Panel    Component Value Date/Time   NA 139 06/27/2022 1352    K 4.5 06/27/2022 1352   CL 103 06/27/2022 1352   CO2 21 06/27/2022 1352   GLUCOSE 99 06/27/2022 1352   GLUCOSE 113 (H) 10/01/2017 1251   BUN 9 06/27/2022 1352   CREATININE 0.57 06/27/2022 1352   CREATININE 0.65 06/24/2016 1455   CALCIUM 9.6 06/27/2022 1352   GFRNONAA 131 06/19/2020 1636   GFRNONAA SEE NOTE 06/24/2016 1455   GFRAA 151 06/19/2020 1636   GFRAA SEE NOTE 06/24/2016 1455    Renal function: Estimated Creatinine Clearance: 165.9 mL/min (by C-G formula based on SCr of 0.57 mg/dL).   ABPM Study Data: Arm Placement left arm  Overall Mean 24hr BP:   135/82 mmHg  HR: 88  Daytime Mean BP:  138/85 mmHg  HR: 89  Nighttime Mean BP:  123/73 mmHg  HR: 85  Dipping Pattern: Yes.    Sys:   11%   Dia: 13.5%   [normal dipping ~10-20%]  Pt average readings are slightly above goal as detailed below: For Office Goal Goal BP of <130/80:  ABPM thresholds: Overall BP < 125/75, daytime BP <130/80 mmHg, sleeptime BP <110/65 mmHg   A/P: History of elevated in-office BP readings without formal dx of hypertension since 2019. Blood pressure slightly uncontrolled above goal of <130/80 mmHg during waking hours without current antihypertensive therapy.  24-hour ambulatory blood pressure evaluation which demonstrates an average AWAKE blood pressure of 138/85 mmHg. Proposed starting amlodipine 2.5 mg  daily to reduce need for lab monitoring, but pt mother recalls that her other daughter was taken off this medication and put on lisinopril to help protect her kidneys. Pt mother would prefer for Janielle to start low-dose lisinopril today.  Nocturnal dipping pattern is normal.   Changes to medications - START lisinopril 5 mg daily  - Obtain BMET to evaluate K and SCr in ~2 weeks  Results reviewed and written information provided.    Written patient instructions provided. Patient verbalized understanding of treatment plan.  Total time in face to face counseling 15 minutes.    Follow-up:  PCP clinic  visit in ~ 1 year.  Patient seen with Nils Pyle,  PharmD Candidate.and Valeda Malm, PharmD, PGY2 Pharmacy Resident.  Marland Kitchen

## 2022-07-10 NOTE — Patient Instructions (Signed)
It was great to see you today!  We would like you start a low-dose blood pressure medication to help get your blood pressure to your goal of less than 130/80.  Start taking lisinopril 5 mg daily (1 tablet once a day)  Please come back to have your labs drawn to monitor your electrolytes after starting this medication

## 2022-07-11 ENCOUNTER — Encounter: Payer: Self-pay | Admitting: Pharmacist

## 2022-07-11 MED ORDER — LISINOPRIL 5 MG PO TABS: 5.0000 mg | ORAL_TABLET | Freq: Every day | ORAL | 3 refills | Status: DC

## 2022-07-11 NOTE — Progress Notes (Signed)
Reviewed: I agree with Dr. Koval's documentation and management. 

## 2022-07-11 NOTE — Assessment & Plan Note (Signed)
History of elevated in-office BP readings without formal dx of hypertension since 2019. Blood pressure slightly uncontrolled above goal of <130/80 mmHg during waking hours without current antihypertensive therapy.  24-hour ambulatory blood pressure evaluation which demonstrates an average AWAKE blood pressure of 138/85 mmHg. Proposed starting amlodipine 2.5 mg daily to reduce need for lab monitoring, but pt mother recalls that her other daughter was taken off this medication and put on lisinopril to help protect her kidneys. Pt mother would prefer for Cassandra Faulkner to start low-dose lisinopril today.  Nocturnal dipping pattern is normal.   Changes to medications - START lisinopril 5 mg daily  - Obtain BMET to evaluate K and SCr in ~2 weeks

## 2022-07-25 ENCOUNTER — Other Ambulatory Visit: Payer: Medicare HMO

## 2022-07-25 ENCOUNTER — Other Ambulatory Visit: Payer: Self-pay | Admitting: Student

## 2022-07-25 DIAGNOSIS — G4701 Insomnia due to medical condition: Secondary | ICD-10-CM

## 2022-07-25 DIAGNOSIS — R030 Elevated blood-pressure reading, without diagnosis of hypertension: Secondary | ICD-10-CM | POA: Diagnosis not present

## 2022-07-25 MED ORDER — DOXEPIN HCL 10 MG/ML PO CONC: 6.0000 mg | Freq: Every day | ORAL | 0 refills | Status: DC

## 2022-07-26 LAB — BASIC METABOLIC PANEL
BUN/Creatinine Ratio: 13 (ref 9–23)
BUN: 8 mg/dL (ref 6–20)
CO2: 20 mmol/L (ref 20–29)
Calcium: 9.6 mg/dL (ref 8.7–10.2)
Chloride: 102 mmol/L (ref 96–106)
Creatinine, Ser: 0.62 mg/dL (ref 0.57–1.00)
Glucose: 130 mg/dL — ABNORMAL HIGH (ref 70–99)
Potassium: 4 mmol/L (ref 3.5–5.2)
Sodium: 139 mmol/L (ref 134–144)
eGFR: 131 mL/min/{1.73_m2} (ref >59)

## 2022-08-01 NOTE — Progress Notes (Deleted)
Cardiology Office Note  Date:  08/01/2022   ID:  Lalania, Haseman July 13, 2001, MRN [id number]  PCP:  [name], MD   No chief complaint on file.   HPI:  [name] is a 21 year old woman with past medical history of Bardet-Biedl syndrome  Retinitis pigmentosa with visual difficulties,S/P surgery for polydactyly.  Morbid obesity PCOS Who presents for f/u of her tachycardia, Bardet-Biedl syndrome   Last seen by myself in clinic December 2022   Last seen by myself in clinic just  In follow-up today, reports feeling well Discussed recent imaging studies and monitor as detailed below Mother interested in trying a medication to curb her appetite given rapid weight gain over the past several years  Normal echo reviewed 07/27/2021 1. Left ventricular ejection fraction, by estimation, is 60 to 65%. The  left ventricle has normal function. The left ventricle has no regional  wall motion abnormalities. Left ventricular diastolic parameters were  normal.   2. Right ventricular systolic function is normal. The right ventricular  size is normal. Tricuspid regurgitation signal is inadequate for assessing  PA pressure.   3. The mitral valve is normal in structure. No evidence of mitral valve  regurgitation. No evidence of mitral stenosis.   4. The aortic valve is normal in structure. Aortic valve regurgitation is  not visualized. No aortic stenosis is present.   5. The inferior vena cava is normal in size with greater than 50%  respiratory variability, suggesting right atrial pressure of 3 mmHg.   ZIO monitor September 2022 reviewed Patient had a min HR of 44 bpm, max HR of 188 bpm, and avg HR of 93 bpm.  Predominant underlying rhythm was Sinus Rhythm.  Isolated SVEs were rare (<1.0%), and no SVE Couplets or SVE Triplets were present. Isolated VEs were rare (<1.0%), and no VE Couplets or VE Triplets were present.  Patient triggered events (2) not associated with  significant arrhythmia  EKG personally reviewed by myself on todays visit Normal sinus rhythm rate 93 bpm no significant ST-T wave changes  Other past medical history reviewed Previously followed by pediatric cardiology Last seen January 2022 Prior notes indicating  no evidence of cardiac disease.  BPs measured at home have been normal.  She she has been taking atorvastatin for cholesterol over 200  Also was started on 4 OCPs.  thyroid dysfunction, which has since improved  Echo done at outside facility shows normal biventricular size and systolic function    PMH:   has a past medical history of Bardet-Biedl syndrome and Chronic otitis media of both ears.  PSH:    Past Surgical History:  Procedure Laterality Date   GALLBLADDER SURGERY      Current Outpatient Medications  Medication Sig Dispense Refill   atorvastatin (LIPITOR) 20 MG tablet Take 1 tablet (20 mg total) by mouth daily. 90 tablet 3   doxepin (SINEQUAN) 10 MG/ML solution Take 0.6 mLs (6 mg total) by mouth at bedtime. 120 mL 0   fish oil-omega-3 fatty acids 1000 MG capsule Take 1 g by mouth daily.     levonorgestrel-ethinyl estradiol (SEASONALE) 0.15-0.03 MG tablet Take 1 tablet by mouth daily.     lisinopril (ZESTRIL) 5 MG tablet Take 1 tablet (5 mg total) by mouth daily. 90 tablet 3   Multiple Vitamins-Minerals (MULTIVITAMIN WITH MINERALS) tablet Take 1 tablet by mouth daily.     Setmelanotide Acetate (IMCIVREE) 10 MG/ML SOLN 0.3 mLs     vitamin E 400 UNIT capsule  Take 400 Units by mouth daily.     No current facility-administered medications for this visit.    Allergies:   Patient has no known allergies.   Social History:  The patient  reports that she has never smoked. She has never been exposed to tobacco smoke. She has never used smokeless tobacco. She reports that she does not drink alcohol.   Family History:   family history includes Breast cancer in her mother; Chromosomal disorder in her sister;  Diabetes in her father; Hypertension in her father.    Review of Systems: Review of Systems  Constitutional: Negative.   HENT: Negative.    Respiratory: Negative.    Cardiovascular: Negative.   Gastrointestinal: Negative.   Musculoskeletal: Negative.   Neurological: Negative.   Psychiatric/Behavioral: Negative.    All other systems reviewed and are negative.    PHYSICAL EXAM: VS:  There were no vitals taken for this visit. , BMI There is no height or weight on file to calculate BMI. Constitutional:  oriented to person, place, and time. No distress.  HENT:  Head: Grossly normal Eyes:  no discharge. No scleral icterus.  Neck: No JVD, no carotid bruits  Cardiovascular: Regular rate and rhythm, no murmurs appreciated Pulmonary/Chest: Clear to auscultation bilaterally, no wheezes or rails Abdominal: Soft.  no distension.  no tenderness.  Musculoskeletal: Normal range of motion Neurological:  normal muscle tone. Coordination normal. No atrophy Skin: Skin warm and dry Psychiatric: normal affect, pleasant   Recent Labs: 06/27/2022: ALT 31; Hemoglobin 12.8; Platelets 440; TSH 4.510 07/25/2022: BUN 8; Creatinine, Ser 0.62; Potassium 4.0; Sodium 139    Lipid Panel Lab Results  Component Value Date   CHOL 156 06/27/2022   HDL 44 06/27/2022   LDLCALC 89 06/27/2022   TRIG 129 06/27/2022      Wt Readings from Last 3 Encounters:  07/10/22 (!) 304 lb 6.4 oz (138.1 kg)  06/27/22 (!) 306 lb 9.6 oz (139.1 kg)  02/07/22 (!) 301 lb 12.8 oz (136.9 kg) (>99 %, Z= 2.87)*   * Growth percentiles are based on CDC (Girls, 2-20 Years) data.      ASSESSMENT AND PLAN:  Problem List Items Addressed This Visit   None Sinus tachycardia Heart rate  very reasonable on ZIO monitor Higher with exertion, likely exacerbated by deconditioning, morbid obesity  Normal echocardiogram No strong indication for metoprolol Mother would prefer no medications and to take metoprolol on an as-needed  basis  Morbid obesity Discussed calorie restriction, Walking program Mother looking into other options, injectables (Invokana?)  Hyperlipidemia For now we will avoid medications, recommended lifestyle modification, low calories  Bardet-Biedl syndrome  No notable cardiac issues noted Long discussion concerning weight management, calorie restriction, exercise program    Total encounter time more than 25 minutes  Greater than 50% was spent in counseling and coordination of care with the patient    Signed, Dossie Arbour, M.D., Ph.D. [facility] [place], [place] [phone]

## 2022-08-02 ENCOUNTER — Ambulatory Visit: Payer: Medicare HMO | Attending: Cardiovascular Disease | Admitting: Cardiovascular Disease

## 2022-08-02 DIAGNOSIS — R000 Tachycardia, unspecified: Secondary | ICD-10-CM

## 2022-08-02 DIAGNOSIS — R030 Elevated blood-pressure reading, without diagnosis of hypertension: Secondary | ICD-10-CM

## 2022-08-02 DIAGNOSIS — R7303 Prediabetes: Secondary | ICD-10-CM

## 2022-08-02 DIAGNOSIS — Q8783 Bardet-Biedl syndrome: Secondary | ICD-10-CM

## 2022-08-02 DIAGNOSIS — E785 Hyperlipidemia, unspecified: Secondary | ICD-10-CM

## 2022-08-13 ENCOUNTER — Encounter: Payer: Self-pay | Admitting: Student

## 2022-08-16 ENCOUNTER — Encounter (HOSPITAL_COMMUNITY): Payer: Self-pay

## 2022-08-16 ENCOUNTER — Ambulatory Visit (HOSPITAL_COMMUNITY)
Admission: EM | Admit: 2022-08-16 | Discharge: 2022-08-16 | Disposition: A | Discharge status: Home / Self Care | Payer: Medicare HMO | Attending: Internal Medicine | Admitting: Internal Medicine

## 2022-08-16 DIAGNOSIS — H6504 Acute serous otitis media, recurrent, right ear: Secondary | ICD-10-CM | POA: Diagnosis not present

## 2022-08-16 MED ORDER — AMOXICILLIN 875 MG PO TABS: 875.0000 mg | ORAL_TABLET | Freq: Two times a day (BID) | ORAL | 0 refills | Status: AC

## 2022-08-16 NOTE — ED Triage Notes (Signed)
Here with mother-reports pt has been complaining of ear pain x 1-2 days.

## 2022-08-16 NOTE — Discharge Instructions (Signed)
Give amoxicillin twice daily (with breakfast and with dinner) for the next 7 days to treat ear infection.  Follow-up with ENT as scheduled in the next 2 weeks.  You may continue giving Tylenol/ibuprofen as needed for pain.  If you develop any new or worsening symptoms or do not improve in the next 2 to 3 days, please return.  If your symptoms are severe, please go to the emergency room.  Follow-up with your primary care provider for further evaluation and management of your symptoms as well as ongoing wellness visits.  I hope you feel better!

## 2022-08-19 NOTE — ED Provider Notes (Signed)
[facility]    CSN: [id number] Arrival date & time: 08/16/22  1607      History   Chief Complaint Chief Complaint  Patient presents with  . Ear Pain    HPI Cassandra Faulkner is a 22 y.o. female.   Patient presents to urgent care for evaluation of     Past Medical History:  Diagnosis Date  . Bardet-Biedl syndrome   . Chronic otitis media of both ears     Patient Active Problem List   Diagnosis Date Noted  . Prediabetes 06/27/2022  . Periorbital hyperpigmentation 10/24/2021  . Healthcare maintenance 06/22/2021  . Menstrual periods irregular 02/14/2021  . Tachycardia 02/14/2021  . Hyperlipidemia 02/14/2021  . Subclinical hypothyroidism 04/10/2020  . PCOS (polycystic ovarian syndrome) 04/10/2020  . Verruca vulgaris 03/27/2020  . Secondary amenorrhea 03/10/2020  . Retinitis pigmentosa 06/24/2016  . Developmental delay 06/24/2016  . Elevated blood-pressure reading without diagnosis of hypertension 05/27/2014  . Obesity 11/18/2007  . Bardet-Biedl syndrome 02/19/2007    Past Surgical History:  Procedure Laterality Date  . GALLBLADDER SURGERY      OB History   No obstetric history on file.      Home Medications    Prior to Admission medications   Medication Sig Start Date End Date Taking? Authorizing Provider  amoxicillin (AMOXIL) 875 MG tablet Take 1 tablet (875 mg total) by mouth 2 (two) times daily for 7 days. 08/16/22 08/23/22 Yes Carlisle Beers, FNP  atorvastatin (LIPITOR) 20 MG tablet Take 1 tablet (20 mg total) by mouth daily. 10/08/21   [name], MD  doxepin (SINEQUAN) 10 MG/ML solution Take 0.6 mLs (6 mg total) by mouth at bedtime. 07/25/22   [name], MD  fish oil-omega-3 fatty acids 1000 MG capsule Take 1 g by mouth daily.    [provider]  levonorgestrel-ethinyl estradiol (SEASONALE) 0.15-0.03 MG tablet Take 1 tablet by mouth daily.    [provider]  lisinopril (ZESTRIL) 5 MG tablet Take 1 tablet  (5 mg total) by mouth daily. 07/11/22   Moses Manners, MD  Multiple Vitamins-Minerals (MULTIVITAMIN WITH MINERALS) tablet Take 1 tablet by mouth daily.    [provider]  Setmelanotide Acetate (IMCIVREE) 10 MG/ML SOLN 0.3 mLs 09/10/21   [provider]  vitamin E 400 UNIT capsule Take 400 Units by mouth daily.    [provider]    Family History Family History  Problem Relation Age of Onset  . Breast cancer Mother   . Hypertension Father   . Diabetes Father   . Chromosomal disorder Sister        bardet biedl    Social History Social History   Tobacco Use  . Smoking status: Never    Passive exposure: Never  . Smokeless tobacco: Never  Vaping Use  . Vaping Use: Never used  Substance Use Topics  . Alcohol use: No     Allergies   Patient has no known allergies.   Review of Systems Review of Systems   Physical Exam Triage Vital Signs ED Triage Vitals  Enc Vitals Group     BP 08/16/22 1721 (!) 147/92     Pulse Rate 08/16/22 1721 (!) 115     Resp 08/16/22 1721 16     Temp 08/16/22 1721 99.1 F (37.3 C)     Temp Source 08/16/22 1721 Oral     SpO2 08/16/22 1721 95 %     Weight --  Height --      Head Circumference --      Peak Flow --      Pain Score 08/16/22 1725 0     Pain Loc --      Pain Edu? --      Excl. in [place]? --    No data found.  Updated Vital Signs BP (!) 147/92 (BP Location: Left Arm)   Pulse (!) 115   Temp 99.1 F (37.3 C) (Oral)   Resp 16   SpO2 95%   Visual Acuity Right Eye Distance:   Left Eye Distance:   Bilateral Distance:    Right Eye Near:   Left Eye Near:    Bilateral Near:     Physical Exam   UC Treatments / Results  Labs (all labs ordered are listed, but only abnormal results are displayed) Labs Reviewed - No data to display  EKG   Radiology No results found.  Procedures Procedures (including critical care time)  Medications Ordered in UC Medications - No data to  display  Initial Impression / Assessment and Plan / UC Course  I have reviewed the triage vital signs and the nursing notes.  Pertinent labs & imaging results that were available during my care of the patient were reviewed by me and considered in my medical decision making (see chart for details).     *** Final Clinical Impressions(s) / UC Diagnoses   Final diagnoses:  Recurrent acute serous otitis media of right ear     Discharge Instructions      Give amoxicillin twice daily (with breakfast and with dinner) for the next 7 days to treat ear infection.  Follow-up with ENT as scheduled in the next 2 weeks.  You may continue giving Tylenol/ibuprofen as needed for pain.  If you develop any new or worsening symptoms or do not improve in the next 2 to 3 days, please return.  If your symptoms are severe, please go to the emergency room.  Follow-up with your primary care provider for further evaluation and management of your symptoms as well as ongoing wellness visits.  I hope you feel better!   ED Prescriptions     Medication Sig Dispense Auth. Provider   amoxicillin (AMOXIL) 875 MG tablet Take 1 tablet (875 mg total) by mouth 2 (two) times daily for 7 days. 14 tablet Talbot Grumbling, FNP      PDMP not reviewed this encounter.

## 2022-08-21 ENCOUNTER — Ambulatory Visit
Admission: RE | Admit: 2022-08-21 | Discharge: 2022-08-21 | Disposition: A | Discharge status: Home / Self Care | Payer: Medicare HMO | Source: Ambulatory Visit | Attending: Family Medicine | Admitting: Family Medicine

## 2022-08-21 DIAGNOSIS — Z9049 Acquired absence of other specified parts of digestive tract: Secondary | ICD-10-CM | POA: Diagnosis not present

## 2022-08-21 DIAGNOSIS — R160 Hepatomegaly, not elsewhere classified: Secondary | ICD-10-CM | POA: Diagnosis not present

## 2022-08-21 DIAGNOSIS — Q8783 Bardet-Biedl syndrome: Secondary | ICD-10-CM | POA: Insufficient documentation

## 2022-09-11 DIAGNOSIS — H9011 Conductive hearing loss, unilateral, right ear, with unrestricted hearing on the contralateral side: Secondary | ICD-10-CM | POA: Diagnosis not present

## 2022-09-11 DIAGNOSIS — H7403 Tympanosclerosis, bilateral: Secondary | ICD-10-CM | POA: Diagnosis not present

## 2022-09-11 DIAGNOSIS — H6983 Other specified disorders of Eustachian tube, bilateral: Secondary | ICD-10-CM | POA: Diagnosis not present

## 2022-09-11 DIAGNOSIS — H7201 Central perforation of tympanic membrane, right ear: Secondary | ICD-10-CM | POA: Diagnosis not present

## 2022-09-17 ENCOUNTER — Encounter (INDEPENDENT_AMBULATORY_CARE_PROVIDER_SITE_OTHER): Payer: Self-pay

## 2022-09-18 DIAGNOSIS — Q8783 Bardet-Biedl syndrome: Secondary | ICD-10-CM | POA: Diagnosis not present

## 2022-09-18 DIAGNOSIS — H3552 Pigmentary retinal dystrophy: Secondary | ICD-10-CM | POA: Diagnosis not present

## 2022-09-22 NOTE — Progress Notes (Unsigned)
Cardiology Office Note  Date:  09/23/2022   ID:  Tayshia, Coppes [birth year], MRN [id number]  PCP:  Eppie Gibson, MD   Chief Complaint  Patient presents with   12 month follow up     "Doing well." Medications reviewed by the patient verbally.     HPI:  [name] is a 22 year old woman with past medical history of Bardet-Biedl syndrome  Retinitis pigmentosa with visual difficulties,S/P surgery for polydactyly.  Morbid obesity PCOS Who presents for f/u of her tachycardia, Bardet-Biedl syndrome   Last seen by myself in clinic December 2022 In follow-up reports she is feeling well Active, takes the dog for frequent walks Tolerating Lipitor, lisinopril Hobbies include learning how to sew, arts and crafts  Weight is down since prior clinic visit, previously 40, weight down to 306 today Blood pressure well-controlled 120/80  EKG personally reviewed by myself on todays visit Sinus tachycardia rate 103 bpm no significant ST-T wave changes  Normal echo reviewed 07/27/2021 1. Left ventricular ejection fraction, by estimation, is 60 to 65%. The  left ventricle has normal function. The left ventricle has no regional  wall motion abnormalities. Left ventricular diastolic parameters were  normal.   2. Right ventricular systolic function is normal. The right ventricular  size is normal. Tricuspid regurgitation signal is inadequate for assessing  PA pressure.   3. The mitral valve is normal in structure. No evidence of mitral valve  regurgitation. No evidence of mitral stenosis.   4. The aortic valve is normal in structure. Aortic valve regurgitation is  not visualized. No aortic stenosis is present.   5. The inferior vena cava is normal in size with greater than 50%  respiratory variability, suggesting right atrial pressure of 3 mmHg.  ZIO monitor September 2022 reviewed Patient had a min HR of 44 bpm, max HR of 188 bpm, and avg HR of 93 bpm.  Predominant underlying  rhythm was Sinus Rhythm.  Isolated SVEs were rare (<1.0%), and no SVE Couplets or SVE Triplets were present. Isolated VEs were rare (<1.0%), and no VE Couplets or VE Triplets were present.  Patient triggered events (2) not associated with significant arrhythmia  Other past medical history reviewed Previously followed by pediatric cardiology Last seen January 2022 Prior notes indicating  no evidence of cardiac disease.  BPs measured at home have been normal.  She she has been taking atorvastatin for cholesterol over 200  Also was started on 4 OCPs.  thyroid dysfunction, which has since improved  Echo done at outside facility shows normal biventricular size and systolic function  PMH:   has a past medical history of Bardet-Biedl syndrome and Chronic otitis media of both ears.  PSH:    Past Surgical History:  Procedure Laterality Date   GALLBLADDER SURGERY      Current Outpatient Medications  Medication Sig Dispense Refill   atorvastatin (LIPITOR) 20 MG tablet Take 1 tablet (20 mg total) by mouth daily. 90 tablet 3   doxepin (SINEQUAN) 10 MG/ML solution Take 0.6 mLs (6 mg total) by mouth at bedtime. 120 mL 0   fish oil-omega-3 fatty acids 1000 MG capsule Take 1 g by mouth daily.     levonorgestrel-ethinyl estradiol (SEASONALE) 0.15-0.03 MG tablet Take 1 tablet by mouth daily.     lisinopril (ZESTRIL) 5 MG tablet Take 1 tablet (5 mg total) by mouth daily. 90 tablet 3   Multiple Vitamins-Minerals (MULTIVITAMIN WITH MINERALS) tablet Take 1 tablet by mouth daily.  Setmelanotide Acetate (IMCIVREE) 10 MG/ML SOLN 0.3 mLs     vitamin E 400 UNIT capsule Take 400 Units by mouth daily.     No current facility-administered medications for this visit.    Allergies:   Patient has no known allergies.   Social History:  The patient  reports that she has never smoked. She has never been exposed to tobacco smoke. She has never used smokeless tobacco. She reports that she does not drink alcohol.    Family History:   family history includes Breast cancer in her mother; Chromosomal disorder in her sister; Diabetes in her father; Hypertension in her father.    Review of Systems: Review of Systems  Constitutional: Negative.   HENT: Negative.    Respiratory: Negative.    Cardiovascular: Negative.   Gastrointestinal: Negative.   Musculoskeletal: Negative.   Neurological: Negative.   Psychiatric/Behavioral: Negative.    All other systems reviewed and are negative.   PHYSICAL EXAM: VS:  BP 124/80 (BP Location: Left Arm, Patient Position: Sitting, Cuff Size: Large)   Pulse (!) 103   Ht 5' 9"$  (1.753 m)   Wt (!) 306 lb 2 oz (138.9 kg)   SpO2 98%   BMI 45.21 kg/m  , BMI Body mass index is 45.21 kg/m. Constitutional:  oriented to person, place, and time. No distress.  HENT:  Head: Grossly normal Eyes:  no discharge. No scleral icterus.  Neck: No JVD, no carotid bruits  Cardiovascular: Regular rate and rhythm, no murmurs appreciated Pulmonary/Chest: Clear to auscultation bilaterally, no wheezes or rails Abdominal: Soft.  no distension.  no tenderness.  Musculoskeletal: Normal range of motion Neurological:  normal muscle tone. Coordination normal. No atrophy Skin: Skin warm and dry Psychiatric: normal affect, pleasant  Recent Labs: 06/27/2022: ALT 31; Hemoglobin 12.8; Platelets 440; TSH 4.510 07/25/2022: BUN 8; Creatinine, Ser 0.62; Potassium 4.0; Sodium 139    Lipid Panel Lab Results  Component Value Date   CHOL 156 06/27/2022   HDL 44 06/27/2022   LDLCALC 89 06/27/2022   TRIG 129 06/27/2022      Wt Readings from Last 3 Encounters:  09/23/22 (!) 306 lb 2 oz (138.9 kg)  07/10/22 (!) 304 lb 6.4 oz (138.1 kg)  06/27/22 (!) 306 lb 9.6 oz (139.1 kg)      ASSESSMENT AND PLAN:  Problem List Items Addressed This Visit       Cardiology Problems   Hyperlipidemia   Relevant Orders   EKG 12-Lead     Other   Elevated blood-pressure reading without diagnosis of  hypertension   Relevant Orders   EKG 12-Lead   Bardet-Biedl syndrome - Primary   Relevant Orders   EKG 12-Lead   Prediabetes   Relevant Orders   EKG 12-Lead   Tachycardia   Relevant Orders   EKG 12-Lead   Other Visit Diagnoses     Dyspnea, unspecified type       Relevant Orders   EKG 12-Lead     Sinus tachycardia Asymptomatic, prior Zio monitor with average heart rate of 90 Normal echocardiogram No strong indication for beta-blocker Blood pressure well-controlled Recommended walking program for conditioning, exercise, continued weight loss  Morbid obesity Discussed calorie restriction, Continue walking program Weight is trending down as detailed above  Hyperlipidemia Cholesterol numbers well-controlled on Lipitor daily   Bardet-Biedl syndrome  No notable cardiac issues noted, no structural heart disease Recommend we continue weight management, calorie restriction, exercise program    Total encounter time more than 30 minutes  Greater than 50% was spent in counseling and coordination of care with the patient    Signed, Esmond Plants, M.D., Ph.D. [place], [place]

## 2022-09-23 ENCOUNTER — Ambulatory Visit: Payer: Medicare HMO | Attending: Cardiovascular Disease | Admitting: Cardiovascular Disease

## 2022-09-23 ENCOUNTER — Encounter: Payer: Self-pay | Admitting: Student

## 2022-09-23 ENCOUNTER — Encounter: Payer: Self-pay | Admitting: Cardiovascular Disease

## 2022-09-23 VITALS — BP 124/80 | HR 103 | Ht 69.0 in | Wt 306.1 lb

## 2022-09-23 DIAGNOSIS — R0600 Dyspnea, unspecified: Secondary | ICD-10-CM | POA: Diagnosis not present

## 2022-09-23 DIAGNOSIS — E782 Mixed hyperlipidemia: Secondary | ICD-10-CM | POA: Diagnosis not present

## 2022-09-23 DIAGNOSIS — R000 Tachycardia, unspecified: Secondary | ICD-10-CM | POA: Diagnosis not present

## 2022-09-23 DIAGNOSIS — R030 Elevated blood-pressure reading, without diagnosis of hypertension: Secondary | ICD-10-CM

## 2022-09-23 DIAGNOSIS — R7303 Prediabetes: Secondary | ICD-10-CM

## 2022-09-23 DIAGNOSIS — G4701 Insomnia due to medical condition: Secondary | ICD-10-CM

## 2022-09-23 DIAGNOSIS — Q8783 Bardet-Biedl syndrome: Secondary | ICD-10-CM

## 2022-09-23 NOTE — Patient Instructions (Signed)
Medication Instructions:  No changes  If you need a refill on your cardiac medications before your next appointment, please call your pharmacy.   Lab work: No new labs needed  Testing/Procedures: No new testing needed  Follow-Up: At [facility], you and your health needs are our priority.  As part of our continuing mission to provide you with exceptional heart care, we have created designated Provider Care Teams.  These Care Teams include your primary Cardiologist (physician) and Advanced Practice Providers (APPs -  Physician Assistants and Nurse Practitioners) who all work together to provide you with the care you need, when you need it.  You will need a follow up appointment in 12 months  Providers on your designated Care Team:   Christopher Berge, NP Ryan Dunn, PA-C Cadence Furth, PA-C  COVID-19 Vaccine Information can be found at: [email or web address] For questions related to vaccine distribution or appointments, please email [email or web address] or call [phone].   

## 2022-09-24 MED ORDER — DOXEPIN HCL 10 MG/ML PO CONC: 6.0000 mg | Freq: Every day | ORAL | 0 refills | Status: DC

## 2022-11-05 DIAGNOSIS — E782 Mixed hyperlipidemia: Secondary | ICD-10-CM | POA: Diagnosis not present

## 2022-11-05 DIAGNOSIS — R7989 Other specified abnormal findings of blood chemistry: Secondary | ICD-10-CM | POA: Diagnosis not present

## 2022-11-05 DIAGNOSIS — K760 Fatty (change of) liver, not elsewhere classified: Secondary | ICD-10-CM | POA: Diagnosis not present

## 2022-11-05 DIAGNOSIS — R7303 Prediabetes: Secondary | ICD-10-CM | POA: Diagnosis not present

## 2022-11-05 DIAGNOSIS — I151 Hypertension secondary to other renal disorders: Secondary | ICD-10-CM | POA: Diagnosis not present

## 2022-11-05 DIAGNOSIS — Q8783 Bardet-Biedl syndrome: Secondary | ICD-10-CM | POA: Diagnosis not present

## 2022-11-07 ENCOUNTER — Encounter: Payer: Self-pay | Admitting: Student

## 2022-11-27 ENCOUNTER — Encounter: Payer: Self-pay | Admitting: Pharmacist

## 2022-11-27 ENCOUNTER — Ambulatory Visit (INDEPENDENT_AMBULATORY_CARE_PROVIDER_SITE_OTHER): Payer: Medicare HMO | Admitting: Pharmacist

## 2022-11-27 VITALS — BP 162/102 | Wt 304.8 lb

## 2022-11-27 DIAGNOSIS — R030 Elevated blood-pressure reading, without diagnosis of hypertension: Secondary | ICD-10-CM

## 2022-11-27 NOTE — Patient Instructions (Signed)
Blood Pressure Activity Diary Time Lying down/ Sleeping Walking/ Exercise Stressed/ Angry Headache/ Pain Dizzy  9 AM       10 AM       11 AM       12 PM       1 PM       2 PM       Time Lying down/ Sleeping Walking/ Exercise Stressed/ Angry Headache/ Pain Dizzy  3 PM       4 PM        5 PM       6 PM       7 PM       8 PM       Time Lying down/ Sleeping Walking/ Exercise Stressed/ Angry Headache/ Pain Dizzy  9 PM       10 PM       11 PM       12 AM       1 AM       2 AM       3 AM       Time Lying down/ Sleeping Walking/ Exercise Stressed/ Angry Headache/ Pain Dizzy  4 AM       5 AM       6 AM       7 AM       8 AM       9 AM       10 AM        Time you woke up: _________                  Time you went to sleep:__________  Come back tomorrow at 8:30 to have the monitor removed Call the [facility] if you have any questions before then ([phone])  Wearing the Blood Pressure Monitor The cuff will inflate every 20 minutes during the day and every 30 minutes while you sleep. Your blood pressure readings will NOT display after cuff inflation Fill out the blood pressure-activity diary during the day, especially during activities that may affect your reading -- such as exercise, stress, walking, taking your blood pressure medications  Important things to know: Avoid taking the monitor off for the next 24 hours, unless it causes you discomfort or pain. Do NOT get the monitor wet and do NOT dry to clean the monitor with any cleaning products. Do NOT put the monitor on anyone else's arm. When the cuff inflates, avoid excess movement. Let the cuffed arm hang loosely, slightly away from the body. Avoid flexing the muscles or moving the hand/fingers. When you go to sleep, make sure that the hose is not kinked. Remember to fill out the blood pressure activity diary. If you experience severe pain or unusual pain (not associated with getting your blood pressure  checked), remove the monitor.  Troubleshooting:  Code  Troubleshooting   1  Check cuff position, tighten cuff   2, 3  Remain still during reading   4, 87  Check air hose connections and make sure cuff is tight   85, 89  Check hose connections and make tubing is not crimped   86  Push START/STOP to restart reading   88, 91  Retry by pushing START/STOP   90  Replace batteries. If problem persists, remove monitor and bring back to   clinic at follow up   97, 98, 99  Service required - Remove monitor and bring back to clinic at   follow up    

## 2022-11-27 NOTE — Progress Notes (Signed)
   S:     Chief Complaint  Patient presents with   Medication Management    Ambulatory bp    22 y.o. female who presents for hypertension evaluation, education, and management.  PMH is significant for Bardet-Biedl Syndrome and obesity. Accompanied by her father Jorja Loa).  Patient was referred and last seen by Primary Care Provider, Dr. Marisue Humble, on 11/07/2022 at the request of endocrinologist, Dr. Felipa Evener.  At last visit with endocrinologist, increasing lisinopril dose was discussed.  Reevaluation of ambulatory BP monitoring was requested to determine out of office blood pressure control.   Medication compliance is reported to be good.  Discussed procedure for wearing the monitor and gave patient written instructions. Monitor was placed on non-dominant arm with instructions to return in the morning.   Current BP Medications include:  lisinopril  daily   O:  Review of Systems  All other systems reviewed and are negative.   Physical Exam Constitutional:      Appearance: She is obese.  Pulmonary:     Effort: Pulmonary effort is normal.  Neurological:     Mental Status: She is alert.  Psychiatric:        Mood and Affect: Mood normal.     Last 3 Office BP readings: BP Readings from Last 3 Encounters:  09/23/22 124/80  08/16/22 (!) 147/92  06/27/22 (!) 161/98    Basic Metabolic Panel    Component Value Date/Time   NA 139 07/25/2022 1527   K 4.0 07/25/2022 1527   CL 102 07/25/2022 1527   CO2 20 07/25/2022 1527   GLUCOSE 130 (H) 07/25/2022 1527   GLUCOSE 113 (H) 10/01/2017 1251   BUN 8 07/25/2022 1527   CREATININE 0.62 07/25/2022 1527   CREATININE 0.65 06/24/2016 1455   CALCIUM 9.6 07/25/2022 1527   GFRNONAA 131 06/19/2020 1636   GFRNONAA SEE NOTE 06/24/2016 1455   GFRAA 151 06/19/2020 1636   GFRAA SEE NOTE 06/24/2016 1455    For Office Goal Goal BP of <130/80:  ABPM thresholds: Overall BP < 125/75, daytime BP <130/80 mmHg, sleeptime BP <110/65 mmHg     A/P: History of elevated blood pressure readings in office, currently taking; lisinopril  daily.  Goal presssure of <130/80.     Set-up blood pressure monitoring device, and written information provided.   Patient verbalized understanding of treatment plan including return to office tomorrow for removal and interpretation.  Total time in face to face counseling 12 minutes.    Follow-up:  Pharmacist 11/28/2022 Patient seen with Jerry Caras, PharmD PGY-1 Pharmacy Resident.

## 2022-11-27 NOTE — Assessment & Plan Note (Signed)
History of elevated blood pressure readings in office, currently taking; lisinopril  daily.  Goal presssure of <130/80.     Set-up blood pressure monitoring device, and written information provided.   Patient verbalized understanding of treatment plan including return to office tomorrow for removal and interpretation.

## 2022-11-28 ENCOUNTER — Encounter: Payer: Self-pay | Admitting: Pharmacist

## 2022-11-28 ENCOUNTER — Ambulatory Visit (INDEPENDENT_AMBULATORY_CARE_PROVIDER_SITE_OTHER): Payer: Medicare HMO | Admitting: Pharmacist

## 2022-11-28 VITALS — BP 137/75

## 2022-11-28 DIAGNOSIS — R030 Elevated blood-pressure reading, without diagnosis of hypertension: Secondary | ICD-10-CM | POA: Diagnosis not present

## 2022-11-28 MED ORDER — LISINOPRIL 10 MG PO TABS
10.0000 mg | ORAL_TABLET | Freq: Every day | ORAL | 3 refills | Status: DC
Start: 2022-11-28 — End: 2023-06-24

## 2022-11-28 NOTE — Assessment & Plan Note (Signed)
History of multiple elevated blood pressure readings. Currently taking; lisinopril 5 mg daily with goal presssure of <130/80. Found to have persistently elevated and uncontrolled systolic blood pressure with 24-hour ambulatory blood pressure evaluation which demonstrates an average AWAKE blood pressure of 136/73 mmHg. Nocturnal dipping pattern is abnormal.   Changes to medications -Increased dose of lisinopril from 5 to 10 mg daily -Monitor for signs and symptoms of hypotension.

## 2022-11-28 NOTE — Progress Notes (Signed)
Reviewed and agree with Dr Koval's plan.   

## 2022-11-28 NOTE — Progress Notes (Signed)
S:     Chief Complaint  Patient presents with   Medication Management    Ambulatory BP f/u   22 y.o. female who presents for hypertension evaluation, education, and management.  PMH is significant for Bardet-Biedl Syndrome and obesity. Accompanied by her father Cassandra Faulkner).  Patient was referred and last seen by Primary Care Provider, Dr. Marisue Humble, on 11/07/2022 at the request of endocrinologist, Dr. Felipa Evener.  At last visit with endocrinologist, increasing lisinopril dose was discussed.  Today she returns for her follow up ambulatory BP monitoring as requested by endo to determine out of office blood pressure control.   Medication compliance is reported to be good.   Current BP Medications include:  lisinopril 5 mg daily    Dietary habits include:  Day #2 - Patient returns to clinic and reports no major complaints. Patient reports they were able to wear the Ambulatory Blood Pressure Cuff for the entire 24 evaluation period.   O:  Review of Systems  All other systems reviewed and are negative.   Physical Exam Vitals reviewed.  Constitutional:      Appearance: Normal appearance. She is obese.  Cardiovascular:     Rate and Rhythm: Normal rate.  Pulmonary:     Effort: Pulmonary effort is normal.  Neurological:     Mental Status: She is alert and oriented to person, place, and time. Mental status is at baseline.  Psychiatric:        Mood and Affect: Mood normal.        Behavior: Behavior normal.        Thought Content: Thought content normal.        Judgment: Judgment normal.    Last 3 Office BP readings: BP Readings from Last 3 Encounters:  11/27/22 (!) 162/102  09/23/22 124/80  08/16/22 (!) 147/92     Basic Metabolic Panel    Component Value Date/Time   NA 139 07/25/2022 1527   K 4.0 07/25/2022 1527   CL 102 07/25/2022 1527   CO2 20 07/25/2022 1527   GLUCOSE 130 (H) 07/25/2022 1527   GLUCOSE 113 (H) 10/01/2017 1251   BUN 8 07/25/2022 1527   CREATININE 0.62  07/25/2022 1527   CREATININE 0.65 06/24/2016 1455   CALCIUM 9.6 07/25/2022 1527   GFRNONAA 131 06/19/2020 1636   GFRNONAA SEE NOTE 06/24/2016 1455   GFRAA 151 06/19/2020 1636   GFRAA SEE NOTE 06/24/2016 1455     ABPM Study Data: Arm Placement right arm  Overall Mean 24hr BP:   136/73 mmHg  HR: 95  Daytime Mean BP:  137/75 mmHg  HR: 95  Nighttime Mean BP:  134/68 mmHg  HR: 94  Dipping Pattern: No.  Sys:   2.5%   Dia: 9.8%   [normal dipping ~10-20%]   For Office Goal Goal BP of <130/80:  ABPM thresholds: Overall BP < 125/75, daytime BP <130/80 mmHg, sleeptime BP <110/65 mmHg     A/P: History of multiple elevated blood pressure readings. Currently taking; lisinopril 5 mg daily with goal presssure of <130/80. Found to have persistently elevated and uncontrolled systolic blood pressure with 24-hour ambulatory blood pressure evaluation which demonstrates an average AWAKE blood pressure of 136/73 mmHg. Nocturnal dipping pattern is abnormal.   Changes to medications -Increased dose of lisinopril from 5 to 10 mg daily -Monitor for signs and symptoms of hypotension.  Results reviewed and written information provided.    Written patient instructions provided. Patient verbalized understanding of treatment plan.  Total time  in face to face counseling 20 minutes.    Follow-up:  PCP clinic visit in 02/19/23  Patient seen with Earl Gala PGY-1 Pharmacy Resident, Revonda Standard, PharmD Candidate and Valeda Malm, PharmD, PGY2 Pharmacy Resident.

## 2022-11-28 NOTE — Patient Instructions (Signed)
It was nice to see you today!  Your goal blood pressure is <130/80 mmHg.  Medication Changes:  Increase to lisinopril 10 mg once daily   Monitor blood pressure at home daily and keep a log (on your phone or piece of paper) to bring with you to your next visit. Write down date, time, blood pressure and pulse.  Keep up the good work with diet and exercise. Aim for a diet full of vegetables, fruit and lean meats (chicken, [place], fish). Try to limit salt intake by eating fresh or frozen vegetables (instead of canned), rinse canned vegetables prior to cooking and do not add any additional salt to meals.

## 2022-12-24 DIAGNOSIS — R160 Hepatomegaly, not elsewhere classified: Secondary | ICD-10-CM | POA: Diagnosis not present

## 2022-12-24 DIAGNOSIS — K760 Fatty (change of) liver, not elsewhere classified: Secondary | ICD-10-CM | POA: Diagnosis not present

## 2022-12-24 DIAGNOSIS — E785 Hyperlipidemia, unspecified: Secondary | ICD-10-CM | POA: Diagnosis not present

## 2022-12-24 DIAGNOSIS — Z008 Encounter for other general examination: Secondary | ICD-10-CM | POA: Diagnosis not present

## 2022-12-24 DIAGNOSIS — F325 Major depressive disorder, single episode, in full remission: Secondary | ICD-10-CM | POA: Diagnosis not present

## 2022-12-24 DIAGNOSIS — Z6832 Body mass index (BMI) 32.0-32.9, adult: Secondary | ICD-10-CM | POA: Diagnosis not present

## 2022-12-24 DIAGNOSIS — I10 Essential (primary) hypertension: Secondary | ICD-10-CM | POA: Diagnosis not present

## 2022-12-24 DIAGNOSIS — E669 Obesity, unspecified: Secondary | ICD-10-CM | POA: Diagnosis not present

## 2022-12-24 DIAGNOSIS — Z8249 Family history of ischemic heart disease and other diseases of the circulatory system: Secondary | ICD-10-CM | POA: Diagnosis not present

## 2022-12-24 DIAGNOSIS — Z833 Family history of diabetes mellitus: Secondary | ICD-10-CM | POA: Diagnosis not present

## 2022-12-28 ENCOUNTER — Other Ambulatory Visit: Payer: Self-pay | Admitting: Student

## 2022-12-28 DIAGNOSIS — I10 Essential (primary) hypertension: Secondary | ICD-10-CM

## 2023-01-21 ENCOUNTER — Other Ambulatory Visit: Payer: Self-pay | Admitting: Student

## 2023-01-21 DIAGNOSIS — I10 Essential (primary) hypertension: Secondary | ICD-10-CM

## 2023-01-21 DIAGNOSIS — E785 Hyperlipidemia, unspecified: Secondary | ICD-10-CM

## 2023-01-21 DIAGNOSIS — Q8783 Bardet-Biedl syndrome: Secondary | ICD-10-CM

## 2023-02-12 ENCOUNTER — Ambulatory Visit (INDEPENDENT_AMBULATORY_CARE_PROVIDER_SITE_OTHER): Payer: Medicaid Other | Admitting: Pediatric Genetics

## 2023-02-19 ENCOUNTER — Encounter (INDEPENDENT_AMBULATORY_CARE_PROVIDER_SITE_OTHER): Payer: Self-pay | Admitting: Genetic Counselor

## 2023-02-19 ENCOUNTER — Ambulatory Visit (INDEPENDENT_AMBULATORY_CARE_PROVIDER_SITE_OTHER): Payer: Medicare HMO | Admitting: Genetic Counselor

## 2023-02-19 VITALS — Ht 67.64 in | Wt 300.2 lb

## 2023-02-19 DIAGNOSIS — Q8783 Bardet-Biedl syndrome: Secondary | ICD-10-CM | POA: Diagnosis not present

## 2023-02-19 DIAGNOSIS — R632 Polyphagia: Secondary | ICD-10-CM | POA: Diagnosis not present

## 2023-02-19 NOTE — Progress Notes (Signed)
MEDICAL GENETICS FOLLOW-UP VISIT  Patient name: Cassandra Faulkner DOB: [birth year] Age: 22 y.o. MRN: [id number]  Initial Referring Provider/Specialty: Levert Feinstein, MD / Family Medicine Date of Evaluation: 02/19/2023 Chief Complaint/Reason for Referral: Cassandra Faulkner syndrome  HPI: Cassandra Faulkner is a 22 y.o. female who presents today for follow-up with Genetics pertaining to her diagnosis of  Bardet Biedl syndrome (BBS). She is accompanied by her mother and sister (also with BBS) at today's visit.   To review, their initial visit with [place] in Genetics was on 05/10/2021, and prior visit was 02/07/2022. They return for routine follow-up.   Since that visit,  Cassandra Faulkner has been working with her PCP Dr. Marisue Humble for surveillance management related to BBS: Pelvic ultrasound (07/2021)- normal. Abdominal ultrasound (08/2022)- "1. Enlarged echogenic liver consistent with hepatic steatosis 2. Generous sized bilateral kidneys which are otherwise morphologically normal." Cardiology- Dr. Mariah Milling, [facility] [place] (09/2022) Echocardiogram normal (07/2021) Sinus tachycardia- ZIO monitor in 04/2021 with avg heart rate of 90 Follow up in 12 months Ophthalmology- Dr. Margaree Mackintosh at [facility] (09/2022) Retinitis pigmentosa, gradual decrease in [place], mild increase in central mottling, stable macular OCT F/u in 1 year Sleep study 07/2021- no sleep apnea. Did show insomnia. Started on doxepin, which has been helping. Endocrinology- Dr. Felipa Evener, [facility] On setmelanotide (Imcivree) since January 2023 Has lost 1 lb 9.6 oz since our last visit (lost 24 lbs the year prior). Appetite decreased, not wanting as large portions or seconds, less snacking. On seasonale for hyperandrogenism. Periods still very irregular. On lisinopril for hypertension Nonalcoholic fatty liver disease- liver tests (AST and ALT) are now decreased and in normal range. On lipitor and omega-3. Last saw 10/2022- follow up  05/14/2023 Annual labs Hepatic enzymes- wnl (10/2022) PT- wnl (06/2022) PTT- wnl (06/2022) CBC- wnl (10/2022) Serum electrolytes- wnl (10/2022)  Creatinine- wnl (10/2022) BUN- wnl (10/2022) Cystatin C- wnl (06/2022) Lipid panel- wnl (10/2022) Fasting blood glucose- wnl (10/2022) HgbA1c- 5.2, wnl (10/2022) Thyroid gland function- TSH, T4, T3 wnl (06/2022) FSH/LH- low (<0.3)- on birth control (06/2022) Estrogen- normal (06/2022) Testosterone- normal (06/2022)  Review of Systems (updates in bold): General: Bardet-Biedl syndrome. Generally doing well- happy and healthy. Interested in more independence and getting a job. Overweight. Eyes/vision: Nyctalopia and photophobia. Sees retinal specialist yearly. Wears glasses. Ears/hearing: no concerns. Dental: no concerns. Respiratory: no concerns. Cardiovascular: tachycardia- thought to be benign. Normal echocardiogram. Following with Cardiology. Gastrointestinal: h/o gallstones s/p cholecystectomy. Improved AST, ALT. NAFL on abd [place]. Genitourinary: Normal renal ultrasound in the past. Has not seen gynecologist or had pap smear but working on this with PCP. Normal pelvic US. Hypertension- on lisinopril. Endocrine: Irregular periods. On OCPs. Possible PCOS. Slightly abnormal thyroid studies in the past- no medication needed. Following with Dr. Felipa Evener at [facility] for Imcivree and pleased with medication and weight/appetite improvements. Hematologic: no concerns. Immunologic: no concerns. Neurological: developmental delays. No seizures. Psychiatric: working on activities of daily living and becoming more independent. 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 Skin, Hair, Nails: no concerns.  Family History: No updates to family history since last visit  Updated Genetic testing: N/a  Pertinent New Labs: See HPI  Pertinent New Imaging/Studies: See HPI  Assessment: Cassandra Faulkner is a 22 y.o.  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 I praised the family's lifestyle changes, diligent work with their care team, and improvement in labs. I am impressed with  the family's efforts.  From a surveillance standpoint, Cassandra Faulkner is largely up to date. We do recommend the following: Annual labs- last checked in either November 2023 or March 2024, continue to follow annually Annual abdominal imaging- due in January 2025 Ophthalmology- annually, due in February 2025 Continue following with other providers as directed (Dr. Marisue Humble, Dr. Felipa Evener, Dr. Mariah Milling)  We continued our previous conversation about plans for the future and developing independence, with emphasis on having discussions and making plans early. I provided resources to the family regarding the Empower U program through the [employer] (a skills-building day program for adults with IDD), His Path Developmental Day program through [employer], and the Independent Living Skills training through the [employer]. We discussed small ways to start building independence, such as learning how to navigate and send messages through MyChart. Finally, I provided information regarding the [employer]. The International BBS Conference will be held July 26-28, 2024 in [place], [place].  Recommendations: Annual labs- last checked in either November 2023 or March 2024, continue to follow annually Annual abdominal imaging- due in January 2025 Ophthalmology- annually, due in February 2025 Continue following with other providers as directed (Dr. Marisue Humble, Dr. Felipa Evener, Dr. Mariah Milling)  Follow- up in 1 year for routine BBS surveillance review (~July 2025).    Charline Bills, MS, CGC Certified Genetic Counselor  Date: 03/14/2023 Time: 11:20 am  Total time spent: 60 Time spent includes face to face and non-face to face care  for the patient on the date of this encounter (history, genetic counseling, coordination of care, data gathering and/or documentation as outlined)

## 2023-02-21 NOTE — Patient Instructions (Addendum)
At [facility], we are committed to providing exceptional care. You will receive a patient satisfaction survey through text or email regarding your visit today. Your opinion is important to me. Comments are appreciated.  Great work on keeping up to date with surveillance and improving lab values! Keep up the good work! We will plan to follow up in one year (around July 2025). The following recommendations were provided: Annual labs- last checked in either November 2023 or March 2024, continue to follow annually Annual abdominal imaging- due in January 2025 Ophthalmology- annually, due in February 2025 Continue following with other providers as directed (Dr. Marisue Humble, Dr. Felipa Evener, Dr. Mariah Milling)  Here are links to some of the resources we discussed today: EmpowerU ([employer] [place]) [employer] His Path Developmental Day Program [facility] [employer]) https://mountjubilee.org/his-path-day-program/ [facility]) https://dac-cil.org/services/independent-living-skills-training/ [employer] https://corpguard.org/

## 2023-03-05 ENCOUNTER — Encounter: Payer: Self-pay | Admitting: Student

## 2023-03-05 DIAGNOSIS — G4701 Insomnia due to medical condition: Secondary | ICD-10-CM

## 2023-03-06 MED ORDER — DOXEPIN HCL 10 MG/ML PO CONC
6.0000 mg | Freq: Every day | ORAL | 3 refills | Status: AC
Start: 2023-03-06 — End: ?

## 2023-03-07 DIAGNOSIS — Z0100 Encounter for examination of eyes and vision without abnormal findings: Secondary | ICD-10-CM | POA: Diagnosis not present

## 2023-05-03 IMAGING — US US PELVIS COMPLETE
1 series · 14 of 25 positions shown · non-contrast
Comparison: None.

CLINICAL DATA: Bardet Biedl syndrome

EXAM:
TRANSABDOMINAL ULTRASOUND OF PELVIS
TECHNIQUE: Transabdominal ultrasound examination of the pelvis was performed
including evaluation of the uterus, ovaries, adnexal regions, and
pelvic cul-de-sac.

[Series 1: us pelvis (transabdominal only) · 43 acquisitions, 14 frames shown]
[im 1/43]
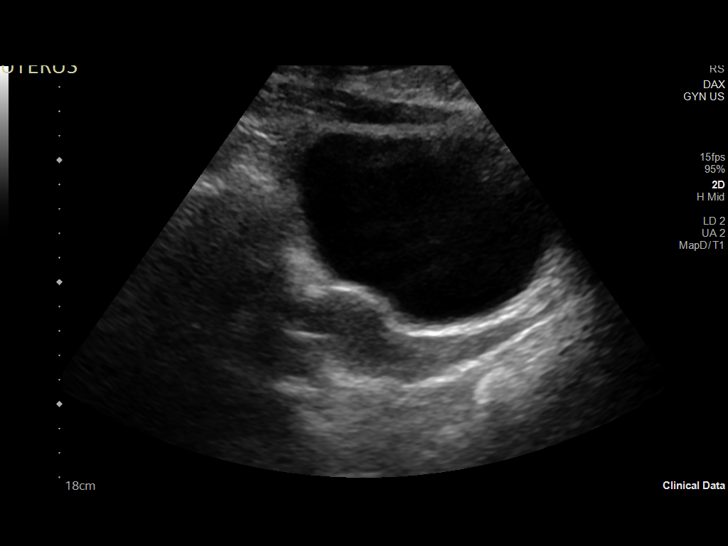
[im 4/43]
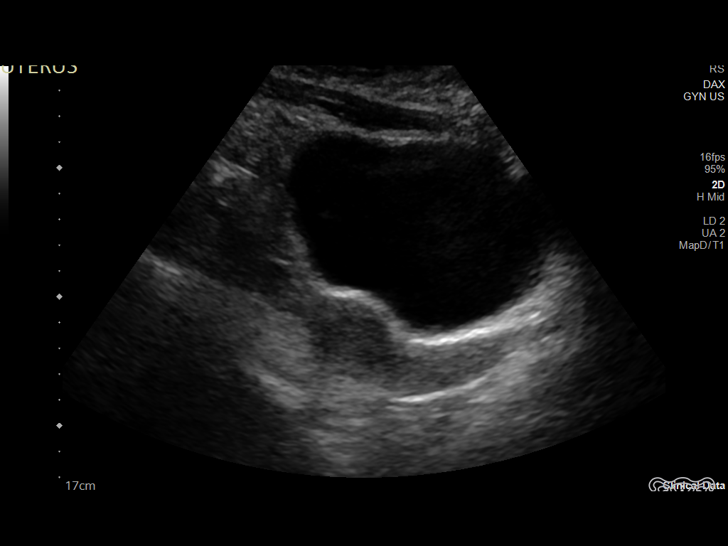
[im 8/43]
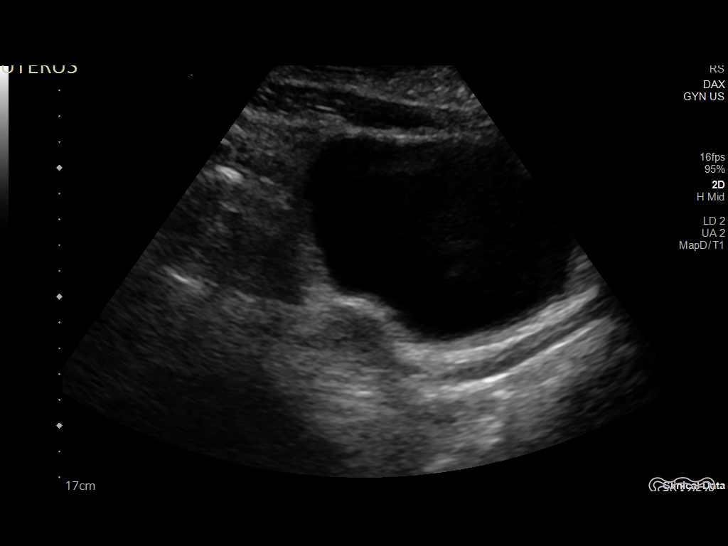
[im 11/43]
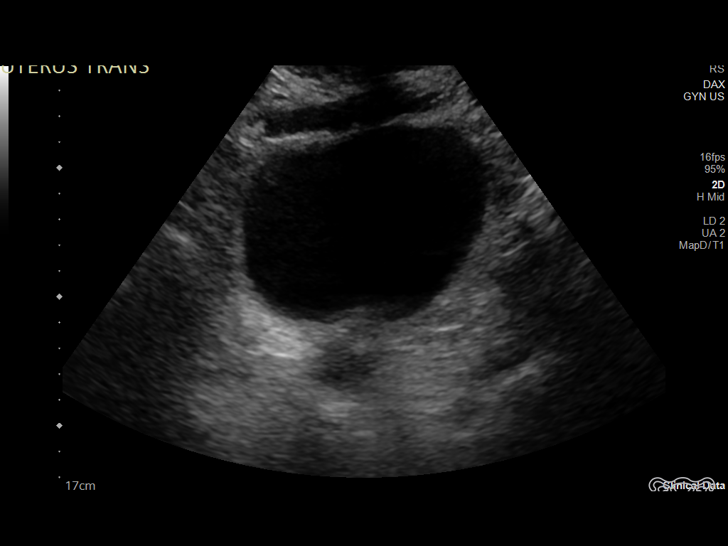
[im 15/43]
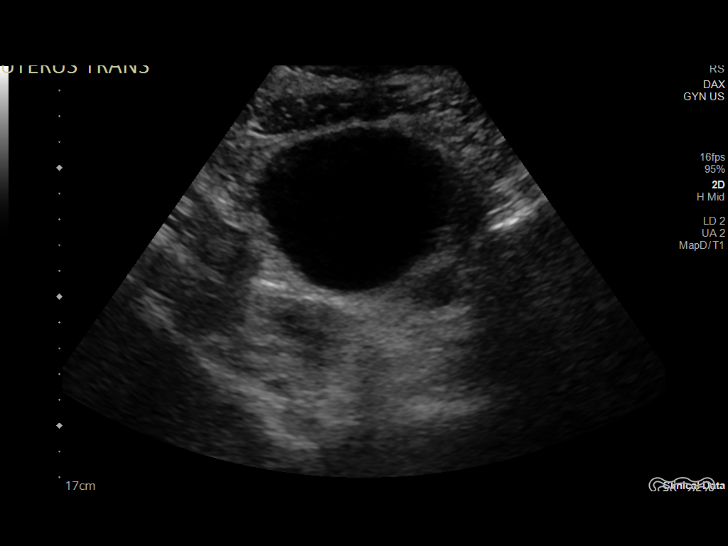
[im 16/43]
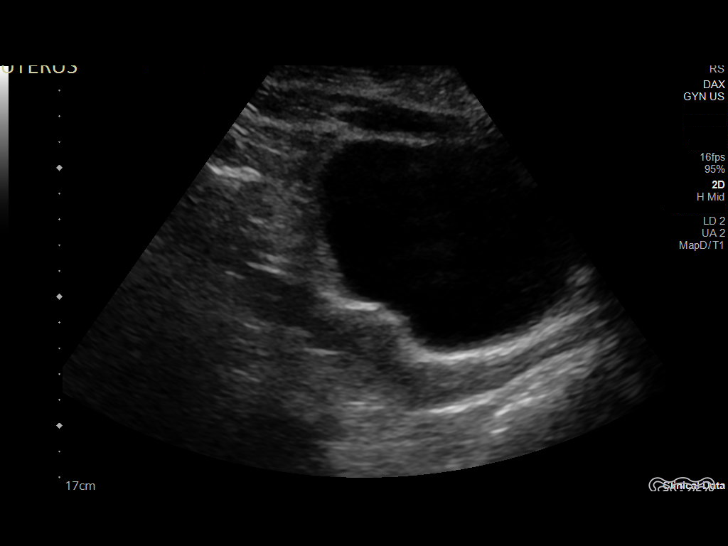
[im 20/43]
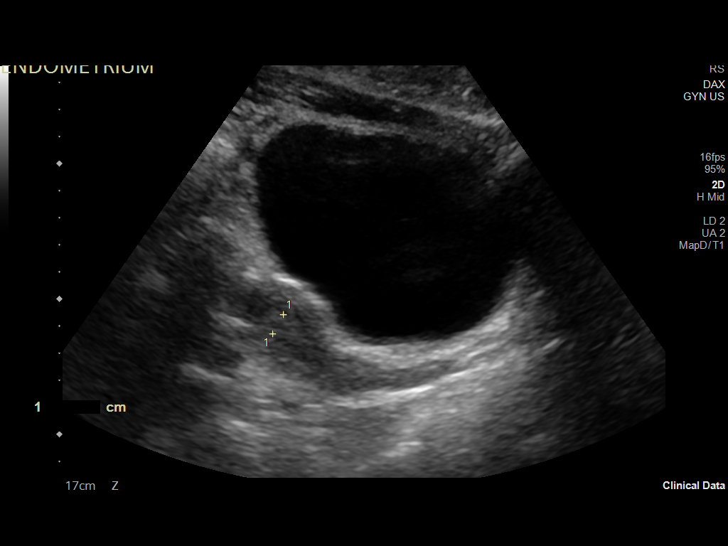
[im 23/43]
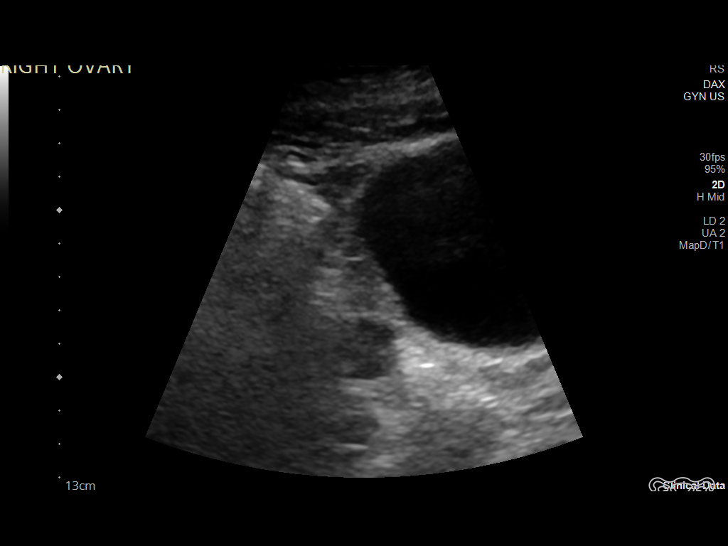
[im 27/43]
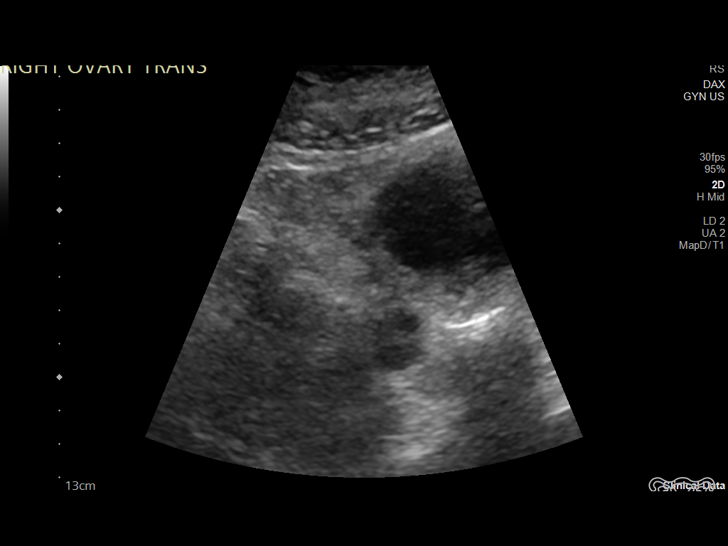
[im 29/43]
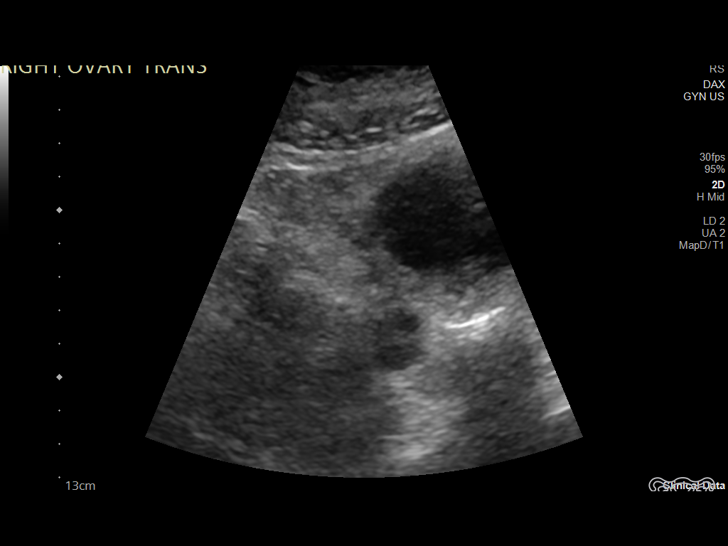
[im 32/43]
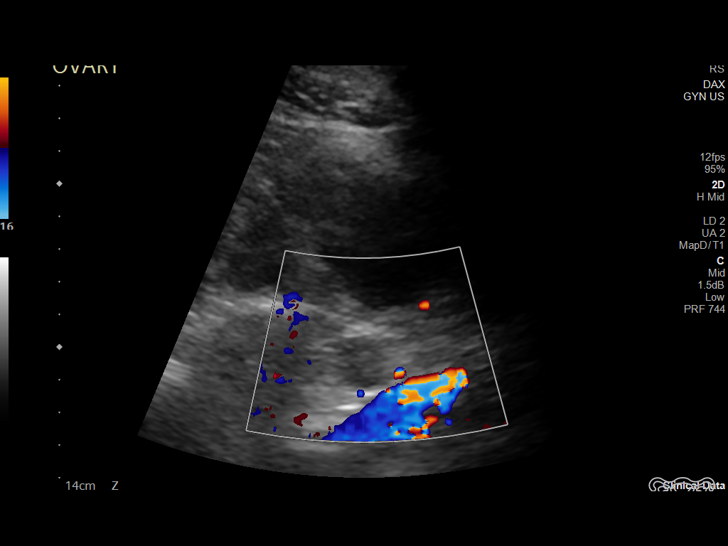
[im 36/43]
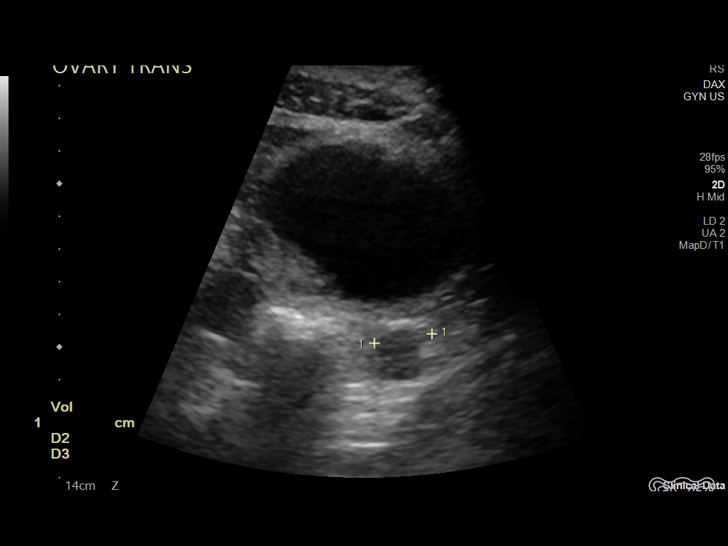
[im 39/43]
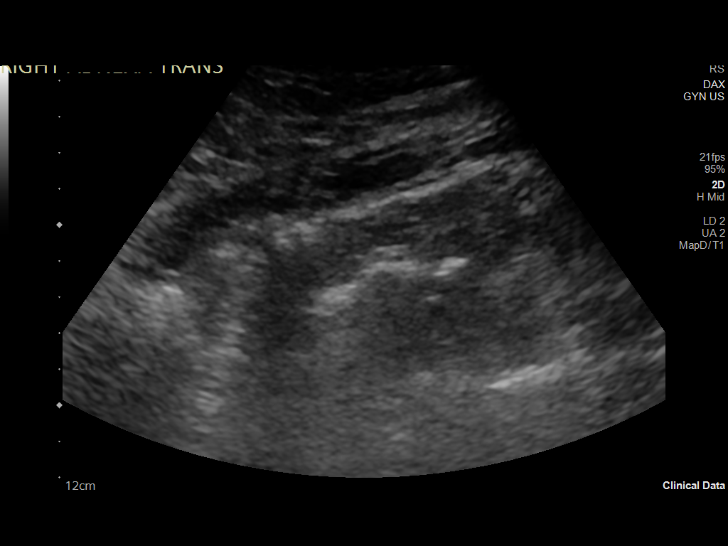
[im 43/43]
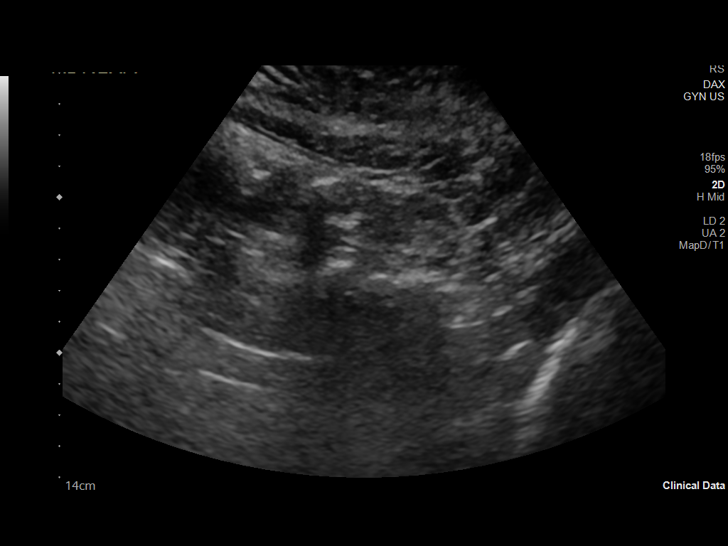

[14 of 25 positions shown; findings below may reference images not displayed]

FINDINGS: Uterus

Measurements: 7.7 x 3.3 x 4.1 cm = volume: 54.1 mL. No fibroids or
other mass visualized.

Endometrium

Thickness: 8 mm.  No focal abnormality visualized.

Right ovary

Measurements: 2.2 x 1.7 x 1.8 cm = volume: 3.5 mL. Normal
appearance/no adnexal mass.

Left ovary

Measurements: 1.9 x 1.8 x 1.8 cm = volume: 3.3 mL. Normal
appearance/no adnexal mass.

Other findings:  No abnormal free fluid.
IMPRESSION: Limited by habitus.  Grossly negative pelvic ultrasound.

## 2023-05-03 IMAGING — US US ABDOMEN COMPLETE
1 series · 14 of 25 positions shown · non-contrast
Comparison: Ultrasound 10/01/2017

CLINICAL DATA: Bardet-Biedl syndrome

EXAM:
ABDOMEN ULTRASOUND COMPLETE

[Series 1: us abdomen complete · 14 of 85 slices shown]
[im 1/85]
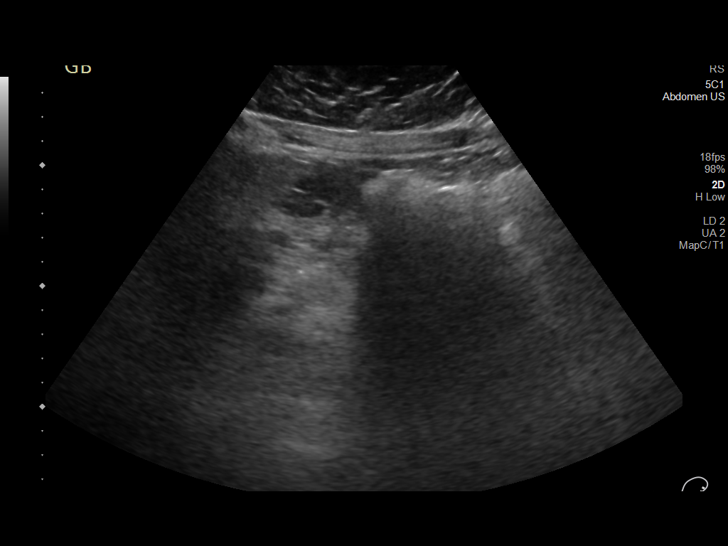
[im 8/85]
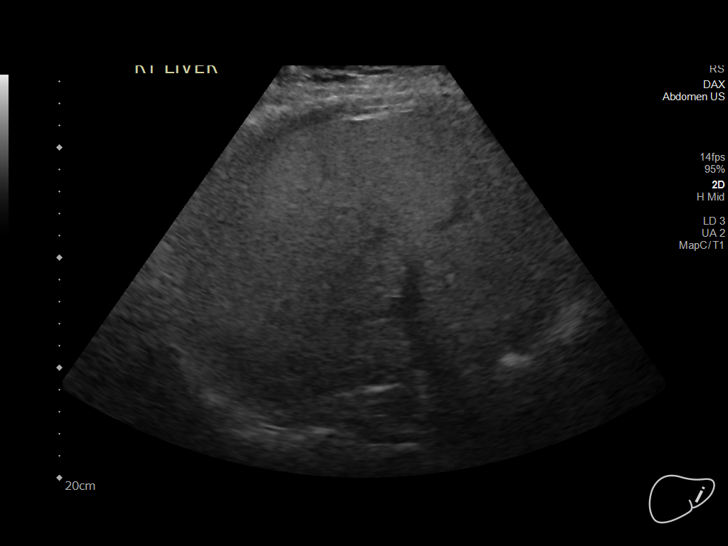
[im 15/85]
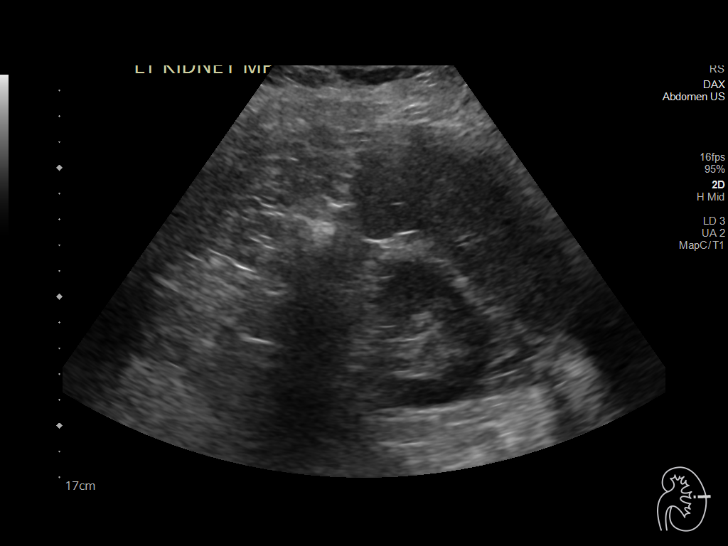
[im 22/85]
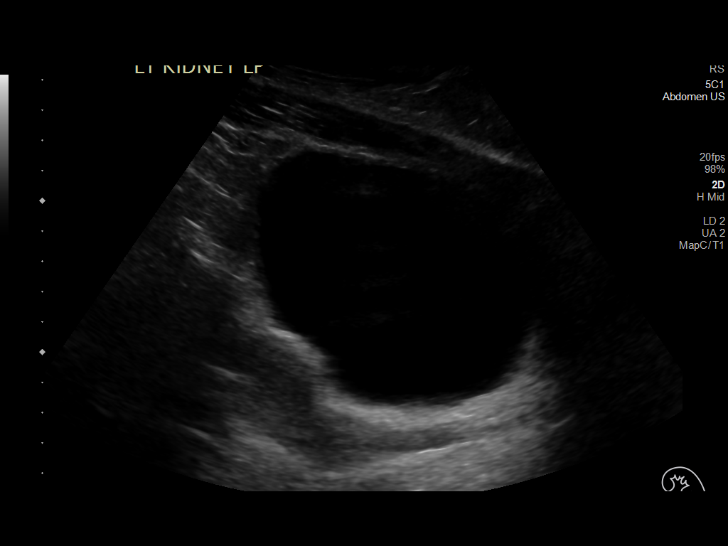
[im 29/85]
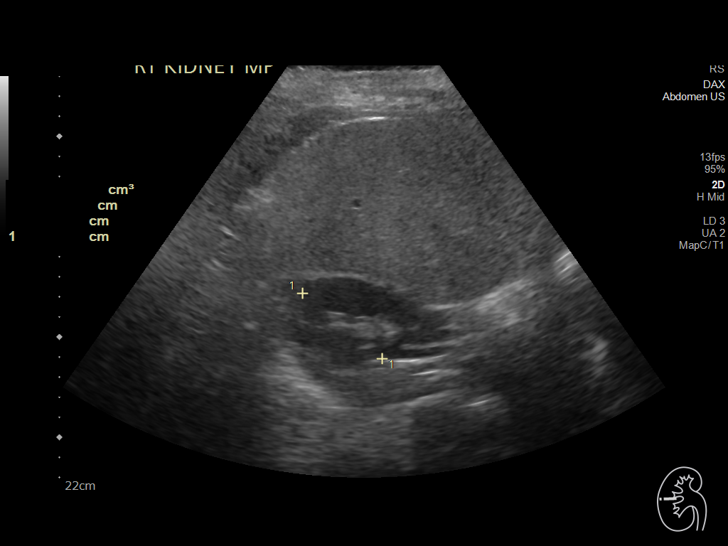
[im 32/85]
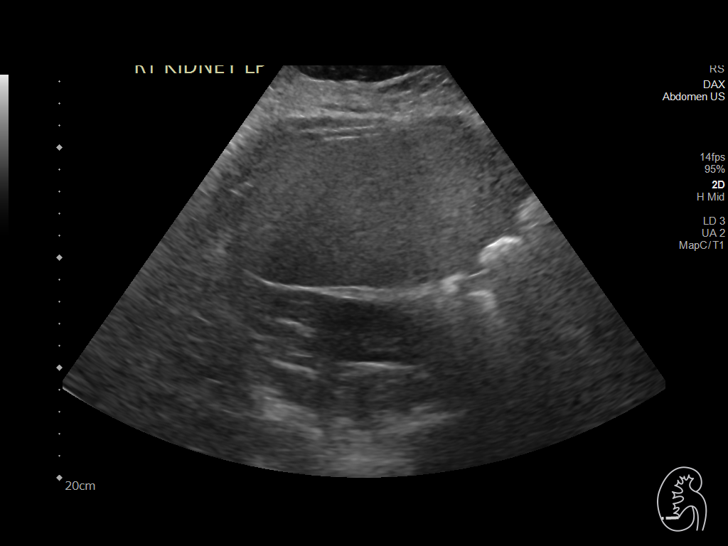
[im 39/85]
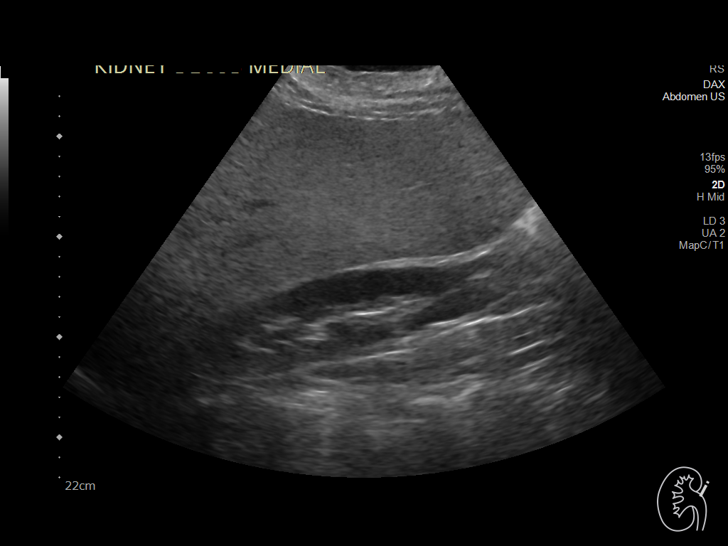
[im 46/85]
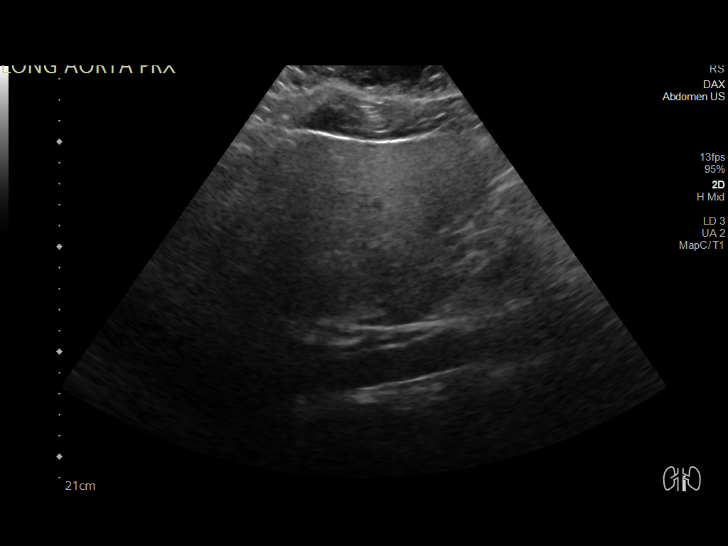
[im 53/85]
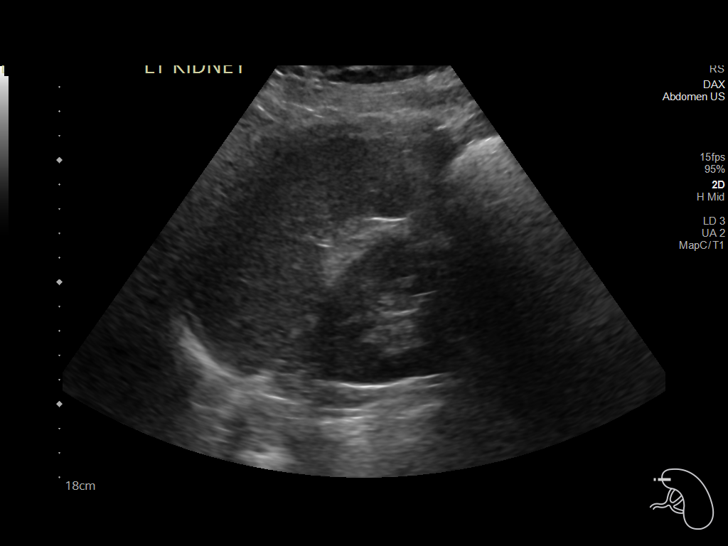
[im 57/85]
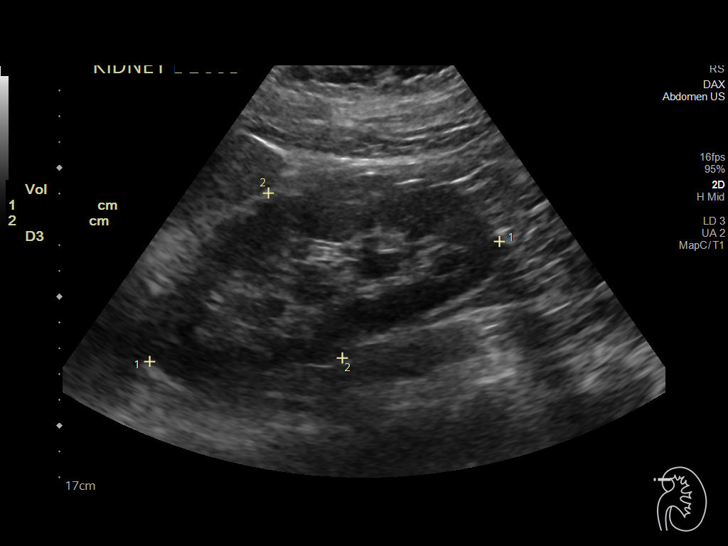
[im 64/85]
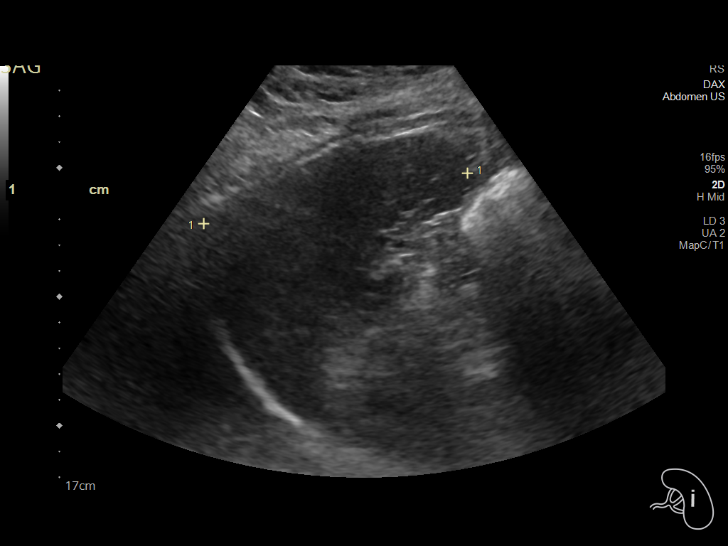
[im 71/85]
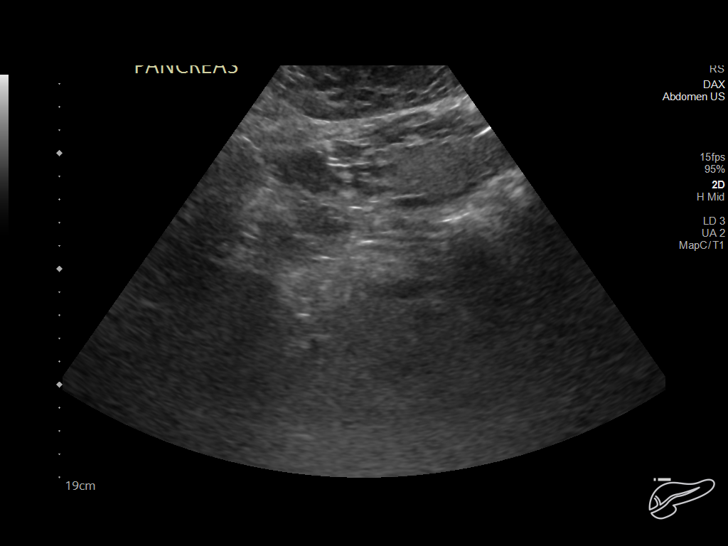
[im 78/85]
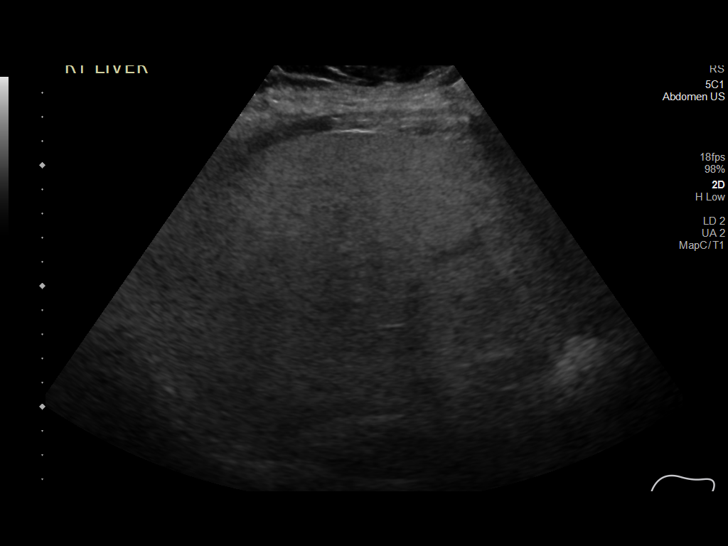
[im 85/85]
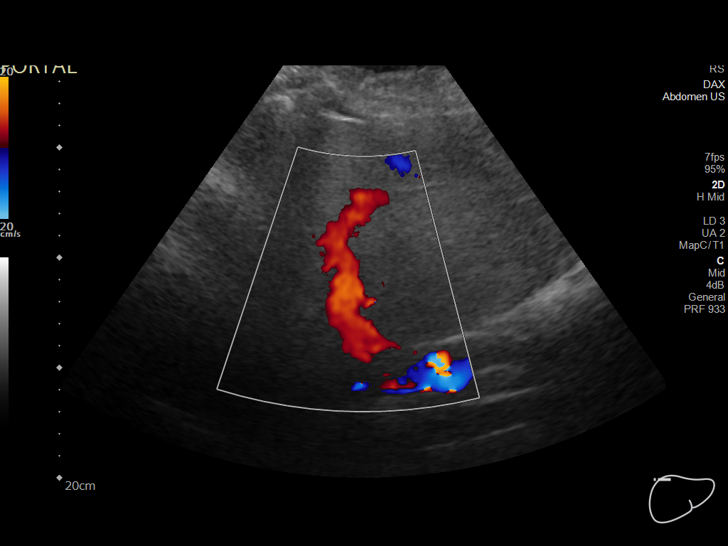

[14 of 25 positions shown; findings below may reference images not displayed]

FINDINGS: Gallbladder: Surgically absent

Common bile duct: Diameter: 3.1 mm

Liver: Appears enlarged and diffusely echogenic. No focal hepatic
abnormality. Portal vein is patent on color Doppler imaging with
normal direction of blood flow towards the liver.

IVC: Not seen due to bowel gas

Pancreas: Not seen due to bowel gas

Spleen: Size and appearance within normal limits.

Right Kidney: Length: 13.9 cm. Echogenicity within normal limits. No
mass or hydronephrosis visualized.

Left Kidney: Length: 14.3 cm. Echogenicity within normal limits. No
mass or hydronephrosis visualized.

Abdominal aorta: No aneurysm visualized.

Other findings: None.
IMPRESSION: 1. Enlarged echogenic liver consistent with hepatic steatosis.
2. Status post cholecystectomy
3. Nonvisualization of pancreas and IVC due to bowel gas

## 2023-05-05 ENCOUNTER — Ambulatory Visit: Payer: Medicare HMO

## 2023-05-05 VITALS — Ht 67.0 in | Wt 300.0 lb

## 2023-05-05 DIAGNOSIS — Z0000 Encounter for general adult medical examination without abnormal findings: Secondary | ICD-10-CM | POA: Diagnosis not present

## 2023-05-05 NOTE — Progress Notes (Signed)
Subjective:   Cassandra Faulkner is a 22 y.o. female who presents for an Initial Medicare Annual Wellness Visit.  Visit Complete: Virtual  I connected with  Cassandra Faulkner on 05/05/23 by a audio enabled telemedicine application and verified that I am speaking with the correct person using two identifiers.  Patient Location: Home  Provider Location: Home Office  I discussed the limitations of evaluation and management by telemedicine. The patient expressed understanding and agreed to proceed.  Vital Signs: Because this visit was a virtual/telehealth visit, some criteria may be missing or patient reported. Any vitals not documented were not able to be obtained and vitals that have been documented are patient reported.   Cardiac Risk Factors include: obesity (BMI >30kg/m2);sedentary lifestyle     Objective:    Today's Vitals   05/05/23 1318  Weight: 300 lb (136.1 kg)  Height: 5\' 7"  (1.702 m)   Body mass index is 46.99 kg/m.     05/05/2023    1:27 PM 06/27/2022    1:30 PM 10/24/2021    2:42 PM 06/22/2021    2:45 PM 06/13/2018   11:59 AM 03/04/2018    3:29 PM 10/01/2017   12:50 PM  Advanced Directives  Does Patient Have a Medical Advance Directive? No No No No No No No  Would patient like information on creating a medical advance directive? Yes (MAU/Ambulatory/Procedural Areas - Information given) No - Patient declined No - Patient declined No - Patient declined No - Patient declined No - Patient declined No - Patient declined    Current Medications (verified) Outpatient Encounter Medications as of 05/05/2023  Medication Sig   atorvastatin (LIPITOR) 20 MG tablet TAKE 1 TABLET (20 MG TOTAL) BY MOUTH DAILY. NEEDS PCP APPOINTMENT BEFORE FURTHER REFILLS   doxepin (SINEQUAN) 10 MG/ML solution Take 0.6 mLs (6 mg total) by mouth at bedtime.   fish oil-omega-3 fatty acids 1000 MG capsule Take 1 g by mouth daily.   levonorgestrel-ethinyl estradiol (SEASONALE) 0.15-0.03 MG tablet Take 1  tablet by mouth daily.   lisinopril (ZESTRIL) 10 MG tablet Take 1 tablet (10 mg total) by mouth daily.   Multiple Vitamins-Minerals (MULTIVITAMIN WITH MINERALS) tablet Take 1 tablet by mouth daily.   Setmelanotide Acetate (IMCIVREE) 10 MG/ML SOLN 0.3 mLs   vitamin E 400 UNIT capsule Take 400 Units by mouth daily.   No facility-administered encounter medications on file as of 05/05/2023.    Allergies (verified) Patient has no known allergies.   History: Past Medical History:  Diagnosis Date   Bardet-Biedl syndrome    Chronic otitis media of both ears    Past Surgical History:  Procedure Laterality Date   GALLBLADDER SURGERY     Family History  Problem Relation Age of Onset   Breast cancer Mother    Hypertension Father    Diabetes Father    Chromosomal disorder Sister        bardet biedl   Social History   Socioeconomic History   Marital status: Single    Spouse name: Not on file   Number of children: Not on file   Years of education: Not on file   Highest education level: Not on file  Occupational History   Not on file  Tobacco Use   Smoking status: Never    Passive exposure: Never   Smokeless tobacco: Never  Vaping Use   Vaping status: Never Used  Substance and Sexual Activity   Alcohol use: No   Drug use: Not on file  Sexual activity: Not on file  Other Topics Concern   Not on file  Social History Narrative   Lives with mother, father, and sister   Social Determinants of Health   Financial Resource Strain: Low Risk  (05/05/2023)   Overall Financial Resource Strain (CARDIA)    Difficulty of Paying Living Expenses: Not hard at all  Food Insecurity: No Food Insecurity (05/05/2023)   Hunger Vital Sign    Worried About Running Out of Food in the Last Year: Never true    Ran Out of Food in the Last Year: Never true  Transportation Needs: No Transportation Needs (05/05/2023)   PRAPARE - Administrator, Civil Service (Medical): No    Lack of  Transportation (Non-Medical): No  Physical Activity: Insufficiently Active (05/05/2023)   Exercise Vital Sign    Days of Exercise per Week: 3 days    Minutes of Exercise per Session: 30 min  Stress: No Stress Concern Present (05/05/2023)   [employer] of Occupational Health - Occupational Stress Questionnaire    Feeling of Stress : Not at all  Social Connections: Moderately Isolated (05/05/2023)   Social Connection and Isolation Panel [NHANES]    Frequency of Communication with Friends and Family: More than three times a week    Frequency of Social Gatherings with Friends and Family: Three times a week    Attends Religious Services: More than 4 times per year    Active Member of Clubs or Organizations: No    Attends Banker Meetings: Never    Marital Status: Never married    Tobacco Counseling Counseling given: Not Answered   Clinical Intake:  Pre-visit preparation completed: Yes  Pain : No/denies pain     Diabetes: No  How often do you need to have someone help you when you read instructions, pamphlets, or other written materials from your doctor or pharmacy?: 1 - Never  Interpreter Needed?: No  Comments: Assisted with visit by mother- Cassandra Faulkner Information entered by :: Kandis Fantasia LPN   Activities of Daily Living    05/05/2023    1:26 PM  In your present state of health, do you have any difficulty performing the following activities:  Hearing? 0  Vision? 1  Difficulty concentrating or making decisions? 1  Walking or climbing stairs? 0  Dressing or bathing? 0  Doing errands, shopping? 1  Preparing Food and eating ? N  Using the Toilet? N  In the past six months, have you accidently leaked urine? N  Do you have problems with loss of bowel control? N  Managing your Medications? Y  Managing your Finances? Y  Housekeeping or managing your Housekeeping? N    Patient Care Team: [name], MD as PCP - General (Family Medicine) Tressie Stalker, MD as Referring Physician (Pediatric Ophthalmology) Antonieta Iba, MD as Consulting Physician (Cardiology) Zola Button, MD as Referring Physician (Internal Medicine)  Indicate any recent Medical Services you may have received from other than [facility] providers in the past year (date may be approximate).     Assessment:   This is a routine wellness examination for [employer].  Hearing/Vision screen Hearing Screening - Comments:: Denies hearing difficulties   Vision Screening - Comments:: Wears rx glasses - up to date with routine eye exams with Dr. Margaree Mackintosh    Goals Addressed             This Visit's Progress    Increase physical activity  Depression Screen    05/05/2023    1:25 PM 06/27/2022    1:29 PM 10/24/2021    2:43 PM 06/22/2021    2:44 PM 04/05/2021    4:22 PM 02/14/2021    3:37 PM 03/27/2020    4:18 PM  PHQ 2/9 Scores  PHQ - 2 Score 0  0 0 0 0 0  PHQ- 9 Score   2 0 0 0 0  Exception Documentation  Medical reason         Fall Risk    05/05/2023    1:26 PM 06/27/2022    1:29 PM 06/22/2021    2:44 PM 03/27/2020    4:18 PM 03/04/2018    3:29 PM  Fall Risk   Falls in the past year? 0 0 0 0 No  Number falls in past yr: 0 0 0 0   Injury with Fall? 0 0 0 0   Risk for fall due to : No Fall Risks      Follow up Falls prevention discussed;Education provided;Falls evaluation completed        MEDICARE RISK AT HOME: Medicare Risk at Home Any stairs in or around the home?: Yes If so, are there any without handrails?: No Home free of loose throw rugs in walkways, pet beds, electrical cords, etc?: Yes Adequate lighting in your home to reduce risk of falls?: Yes Life alert?: No Use of a cane, walker or w/c?: No Grab bars in the bathroom?: Yes Shower chair or bench in shower?: No Elevated toilet seat or a handicapped toilet?: No  TIMED UP AND GO:  Was the test performed? No    Cognitive Function:        05/05/2023    1:27 PM  6CIT Screen   What Year? 0 points  What month? 0 points  What time? 0 points  Count back from 20 0 points  Months in reverse 0 points  Repeat phrase 0 points  Total Score 0 points    Immunizations Immunization History  Administered Date(s) Administered   DTaP 07/13/2002, 10/04/2002, 11/05/2002, 08/26/2003, 10/09/2006   HIB (PRP-OMP) 07/13/2002, 10/04/2002, 11/05/2002, 04/19/2003   HPV Quadrivalent 05/24/2013, 07/26/2013, 10/29/2013   Hepatitis A 10/09/2006, 04/11/2009   Hepatitis B 2002-02-14, 06/09/2002, 10/13/2002, 04/11/2009   IPV 07/13/2002, 10/04/2002, 11/05/2002, 04/09/2007   Influenza Split 05/22/2012   Influenza,inj,Quad PF,6+ Mos 05/24/2013, 05/25/2014, 06/19/2015, 06/21/2016, 05/30/2017, 05/18/2018, 05/11/2019, 06/19/2020, 06/22/2021   Influenza-Unspecified 06/22/2016, 05/21/2017, 05/17/2022, 04/20/2023   MMR 04/19/2003, 10/09/2006   Meningococcal Conjugate 05/24/2013   Meningococcal Mcv4o 05/11/2019   PFIZER(Purple Top)SARS-COV-2 Vaccination 11/11/2019, 12/06/2019, 09/10/2020   Pfizer Covid-19 Vaccine Bivalent Booster 89yrs & up 06/09/2021, 05/17/2022   Pfizer(Comirnaty)Fall Seasonal Vaccine 12 years and older 04/20/2023   Pneumococcal-Unspecified 07/13/2002, 10/04/2002, 04/19/2003   Tdap 05/24/2013   Varicella 04/19/2003   Zoster, Live 10/09/2006    TDAP status: Up to date  Flu Vaccine status: Up to date  Pneumococcal vaccine status: Up to date  Covid-19 vaccine status: Completed vaccines  Qualifies for Shingles Vaccine? No    Screening Tests Health Maintenance  Topic Date Due   Cervical Cancer Screening (Pap smear)  Never done   DTaP/Tdap/Td (7 - Td or Tdap) 05/25/2023   Medicare Annual Wellness (AWV)  05/04/2024   INFLUENZA VACCINE  Completed   HPV VACCINES  Completed   COVID-19 Vaccine  Completed   Hepatitis C Screening  Completed   HIV Screening  Completed    Health Maintenance  Health Maintenance Due  Topic Date Due  Cervical Cancer Screening (Pap  smear)  Never done    Lung Cancer Screening: (Low Dose CT Chest recommended if Age 35-80 years, 20 pack-year currently smoking OR have quit w/in 15years.) does not qualify.   Lung Cancer Screening Referral: n/a  Additional Screening:  Hepatitis C Screening: does qualify; Completed 06/19/20  Vision Screening: Recommended annual ophthalmology exams for early detection of glaucoma and other disorders of the eye. Is the patient up to date with their annual eye exam?  Yes  Who is the provider or what is the name of the office in which the patient attends annual eye exams? Dr. Margaree Mackintosh If pt is not established with a provider, would they like to be referred to a provider to establish care? No .   Dental Screening: Recommended annual dental exams for proper oral hygiene  Community Resource Referral / Chronic Care Management: CRR required this visit?  No   CCM required this visit?  No     Plan:     I have personally reviewed and noted the following in the patient's chart:   Medical and social history Use of alcohol, tobacco or illicit drugs  Current medications and supplements including opioid prescriptions. Patient is not currently taking opioid prescriptions. Functional ability and status Nutritional status Physical activity Advanced directives List of other physicians Hospitalizations, surgeries, and ER visits in previous 12 months Vitals Screenings to include cognitive, depression, and falls Referrals and appointments  In addition, I have reviewed and discussed with patient certain preventive protocols, quality metrics, and best practice recommendations. A written personalized care plan for preventive services as well as general preventive health recommendations were provided to patient.     Kandis Fantasia [place], [place]   10/17/6576   After Visit Summary: (MyChart) Due to this being a telephonic visit, the after visit summary with patients personalized plan was offered to  patient via MyChart   Nurse Notes: No concerns at this time

## 2023-05-05 NOTE — Patient Instructions (Signed)
Cassandra Faulkner , Thank you for taking time to come for your Medicare Wellness Visit. I appreciate your ongoing commitment to your health goals. Please review the following plan we discussed and let me know if I can assist you in the future.   Referrals/Orders/Follow-Ups/Clinician Recommendations: Aim for 30 minutes of exercise or brisk walking, 6-8 glasses of water, and 5 servings of fruits and vegetables each day.  This is a list of the screening recommended for you and due dates:  Health Maintenance  Topic Date Due   Pap Smear  Never done   DTaP/Tdap/Td vaccine (7 - Td or Tdap) 05/25/2023   Medicare Annual Wellness Visit  05/04/2024   Flu Shot  Completed   HPV Vaccine  Completed   COVID-19 Vaccine  Completed   Hepatitis C Screening  Completed   HIV Screening  Completed    Advanced directives: (ACP Link)Information on Advanced Care Planning can be found at [facility] Advance Health Care Directives Advance Health Care Directives ([email or web address]   Next Medicare Annual Wellness Visit scheduled for next year: Yes

## 2023-05-14 DIAGNOSIS — R7303 Prediabetes: Secondary | ICD-10-CM | POA: Diagnosis not present

## 2023-05-14 DIAGNOSIS — R7989 Other specified abnormal findings of blood chemistry: Secondary | ICD-10-CM | POA: Diagnosis not present

## 2023-05-14 DIAGNOSIS — K760 Fatty (change of) liver, not elsewhere classified: Secondary | ICD-10-CM | POA: Diagnosis not present

## 2023-05-14 DIAGNOSIS — I151 Hypertension secondary to other renal disorders: Secondary | ICD-10-CM | POA: Diagnosis not present

## 2023-05-14 DIAGNOSIS — Q8783 Bardet-Biedl syndrome: Secondary | ICD-10-CM | POA: Diagnosis not present

## 2023-05-14 DIAGNOSIS — E782 Mixed hyperlipidemia: Secondary | ICD-10-CM | POA: Diagnosis not present

## 2023-06-24 ENCOUNTER — Encounter: Payer: Self-pay | Admitting: Student

## 2023-06-24 ENCOUNTER — Ambulatory Visit (INDEPENDENT_AMBULATORY_CARE_PROVIDER_SITE_OTHER): Payer: Medicare HMO | Admitting: Student

## 2023-06-24 VITALS — BP 130/80 | HR 99 | Ht 68.5 in | Wt 307.0 lb

## 2023-06-24 DIAGNOSIS — I10 Essential (primary) hypertension: Secondary | ICD-10-CM

## 2023-06-24 DIAGNOSIS — R030 Elevated blood-pressure reading, without diagnosis of hypertension: Secondary | ICD-10-CM

## 2023-06-24 DIAGNOSIS — R7303 Prediabetes: Secondary | ICD-10-CM | POA: Diagnosis not present

## 2023-06-24 DIAGNOSIS — Q8783 Bardet-Biedl syndrome: Secondary | ICD-10-CM | POA: Diagnosis not present

## 2023-06-24 DIAGNOSIS — E785 Hyperlipidemia, unspecified: Secondary | ICD-10-CM | POA: Diagnosis not present

## 2023-06-24 MED ORDER — ATORVASTATIN CALCIUM 20 MG PO TABS: 20.0000 mg | ORAL_TABLET | Freq: Every day | ORAL | 1 refills | Status: DC

## 2023-06-24 MED ORDER — LEVONORGEST-ETH ESTRAD 91-DAY 0.15-0.03 MG PO TABS: 1.0000 | ORAL_TABLET | Freq: Every day | ORAL | 3 refills | Status: DC

## 2023-06-24 MED ORDER — LISINOPRIL 10 MG PO TABS: 10.0000 mg | ORAL_TABLET | Freq: Every day | ORAL | 3 refills | Status: AC

## 2023-06-24 NOTE — Patient Instructions (Addendum)
Ree Kida to see you! We'll check your labs today and we'll get your ultrasound scheduled over at [facility].  Eliezer Mccoy, MD

## 2023-06-24 NOTE — Progress Notes (Unsigned)
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 Erik Obey as infants. 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 These extra digits were surgically removed shortly after birth. She has had normal renal ultrasounds in the past and does not have any blood pressure concerns. She has a history of gallstones at 17 and underwent cholecystectomy. Mother states liver labs have been normal so she has not had an abdominal ultrasound since that time. She established with cardiology in 2022 due to tachycardia. EKG and echocardiogram in July were normal. ZIO monitor was reassuring. Tachycardia thought to be exertional in the setting of obesity/deconditioning.    Cassandra Faulkner is overweight- however she established care with endocrinologist Dr. Felipa Evener at [facility] about a year ago and was started on Imcivree and has lost 25-30lbs. Beyond that, she has noticed a smaller waist circumference. Her primary side effect with the Imcivree has been diffuse skin hyperpigmentation. She does have some hyperpigmentation at the nape of the neck consistent with acanthosis nigricans. Had a slightly elevated A1c to 5.7% last year. Has been trying to make healthy food choices and exercising by dancing. She has evidence of hepatic steatosis on her most recent abdominal ultrasound (2022). She has had a sleep study in April of 2023 that was normal. She has had thyroid studies that were slightly abnormal in the past but she has not required medication. 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 She had a normal pelvic US in 12/22. She has expressed interest in having children in the future.    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 Mother states her vision loss has not progressed as quickly as her sister's. She follows with a retinal specialist yearly. Developmentally, Cassandra Faulkner was  delayed in meeting milestones. She was in resource classes in school and recently graduated high school. She is interested in getting a job and would like to work with animals. Her mom hopes that she will be able to get a job at the veterinarian's office where her mom is working. She does have a desire to be more independent, social, and have a boyfriend. She and her sister would like to move into a house together.    Bardet-Biedl syndrome (BBS) is a genetic condition that affects many body systems. Symptoms may vary between individuals even within the same family. Some of the major features include extra fingers and toes (polydactyly), obesity related to hyperphagia, developmental delays and learning differences, dental abnormalities, loss of sense of smell, hypogonadotropic hypogonadism, genitourinary abnormalities, renal and liver disease, cardiac abnormalities, and vision concerns. Vision abnormalities typically begin with night blindness in mid childhood, followed by loss of peripheral vision and poor visual acuity.     The following surveillance is recommended in those with BBS per [employer]: At each visit: Measure height, weight, head and waist circumference Assess daily physical activity level and dietary intake Measure blood pressure Assess for symptoms of scoliosis, sleep apnea, recurrent infection, IBD, celiac disease, neurogenic bladder, and bladder outflow obstruction. Sleep study normal earlier this year.  Every 6 months Routine dental care. Yes.  Annually Ophthalmology- to include visual field testing, electroretinography, and assessment for cataracts. Yes- [employer].  Liver and renal ultrasound Labs- hepatic enzymes, PT, PTT, CBC, serum electrolytes, creatinine, BUN, cystatin C, lipid panel, fasting blood glucose, HgbA1c, thyroid gland function, FSH, LH, estrogen, testosterone.  Other  Initial echocardiogram to assess for cardiac defects and cardiomyopathy; Complete  abdominal ultrasound to assess for laterality defects If normal, further evaluation only needed if cardiac symptoms develop Initial pelvic ultrasound in females to assess for structural differences. Done last year--normal Consider brain MRI if neurological abnormalities Consider neuropsychiatric evaluation is signs/symptoms of atypical behaviors or mood disorder Consider skeletal survey if signs of scoliosis or joint disease  Walking the dog around the block.   PERTINENT  PMH / PSH: ***  OBJECTIVE:   There were no vitals taken for this visit.  ***  ASSESSMENT/PLAN:   No problem-specific Assessment & Plan notes found for this encounter.     Eliezer Mccoy, MD [facility]

## 2023-06-25 LAB — COMPREHENSIVE METABOLIC PANEL
ALT: 28 [IU]/L (ref 0–32)
AST: 17 [IU]/L (ref 0–40)
Albumin: 4.3 g/dL (ref 4.0–5.0)
Alkaline Phosphatase: 49 [IU]/L (ref 44–121)
BUN/Creatinine Ratio: 15 (ref 9–23)
BUN: 9 mg/dL (ref 6–20)
Bilirubin Total: <0.2 mg/dL (ref 0.0–1.2)
CO2: 20 mmol/L (ref 20–29)
Calcium: 9.9 mg/dL (ref 8.7–10.2)
Chloride: 102 mmol/L (ref 96–106)
Creatinine, Ser: 0.6 mg/dL (ref 0.57–1.00)
Globulin, Total: 2.6 g/dL (ref 1.5–4.5)
Glucose: 110 mg/dL — ABNORMAL HIGH (ref 70–99)
Potassium: 4.2 mmol/L (ref 3.5–5.2)
Sodium: 139 mmol/L (ref 134–144)
Total Protein: 6.9 g/dL (ref 6.0–8.5)
eGFR: 131 mL/min/{1.73_m2} (ref >59)

## 2023-06-25 LAB — MICROALBUMIN / CREATININE URINE RATIO
Creatinine, Urine: 223.8 mg/dL
Microalb/Creat Ratio: 205 mg/g{creat} — ABNORMAL HIGH (ref 0–29)
Microalbumin, Urine: 458.4 ug/mL

## 2023-06-25 LAB — TSH RFX ON ABNORMAL TO FREE T4: TSH: 7.2 u[IU]/mL — ABNORMAL HIGH (ref 0.450–4.500)

## 2023-06-25 LAB — T4F: T4,Free (Direct): 1.05 ng/dL (ref 0.82–1.77)

## 2023-06-25 LAB — CYSTATIN C: CYSTATIN C: 0.74 mg/L (ref 0.60–1.00)

## 2023-06-25 NOTE — Assessment & Plan Note (Signed)
She is doing quite well. Dr. Felipa Evener has been following labs this year, including all recommended BBS labs aside from a TSH and cystatin C. - Cystatin C, TSH, repeat CMP, UACR - Abdominal ultrasound to be done at [facility] - Continue follow-up with Dr. Felipa Evener. Weight loss has stalled, I do not know enough about Imcivree to know if dose adjustments are appropriate, but if we have reached maximal benefit, I wonder if there is a role for a GLP-1a agent in her case?

## 2023-06-25 NOTE — Assessment & Plan Note (Signed)
BP is right at goal on lisinopril 10mg  daily.  - Continue lisinopril 10mg  daily - Assessing renal function with UACR, CMP, and Abdominal US as above

## 2023-06-25 NOTE — Assessment & Plan Note (Signed)
A1c has actually normalized (5.2% last month) with lifestyle changes and weight loss on the Imcivree!

## 2023-06-30 ENCOUNTER — Encounter: Payer: Medicare HMO | Admitting: Family Medicine

## 2023-07-02 ENCOUNTER — Encounter: Payer: Self-pay | Admitting: Student

## 2023-07-03 ENCOUNTER — Other Ambulatory Visit: Payer: Self-pay | Admitting: Student

## 2023-07-03 DIAGNOSIS — R808 Other proteinuria: Secondary | ICD-10-CM

## 2023-07-03 MED ORDER — EMPAGLIFLOZIN 10 MG PO TABS: 10.0000 mg | ORAL_TABLET | Freq: Every day | ORAL | 3 refills | Status: DC

## 2023-07-08 ENCOUNTER — Ambulatory Visit
Admission: RE | Admit: 2023-07-08 | Discharge: 2023-07-08 | Disposition: A | Discharge status: Home / Self Care | Payer: Medicare HMO | Source: Ambulatory Visit | Attending: Family Medicine | Admitting: Family Medicine

## 2023-07-08 DIAGNOSIS — Q8783 Bardet-Biedl syndrome: Secondary | ICD-10-CM | POA: Insufficient documentation

## 2023-07-08 DIAGNOSIS — N1330 Unspecified hydronephrosis: Secondary | ICD-10-CM | POA: Diagnosis not present

## 2023-07-08 DIAGNOSIS — Z9049 Acquired absence of other specified parts of digestive tract: Secondary | ICD-10-CM | POA: Diagnosis not present

## 2023-09-02 ENCOUNTER — Encounter: Payer: Self-pay | Admitting: Student

## 2023-09-11 DIAGNOSIS — H6121 Impacted cerumen, right ear: Secondary | ICD-10-CM | POA: Diagnosis not present

## 2023-09-11 DIAGNOSIS — H6983 Other specified disorders of Eustachian tube, bilateral: Secondary | ICD-10-CM | POA: Diagnosis not present

## 2023-09-11 DIAGNOSIS — H7201 Central perforation of tympanic membrane, right ear: Secondary | ICD-10-CM | POA: Diagnosis not present

## 2023-09-16 ENCOUNTER — Other Ambulatory Visit: Payer: Self-pay | Admitting: Family Medicine

## 2023-09-16 ENCOUNTER — Ambulatory Visit (INDEPENDENT_AMBULATORY_CARE_PROVIDER_SITE_OTHER): Payer: Medicare HMO | Admitting: Student

## 2023-09-16 ENCOUNTER — Encounter: Payer: Self-pay | Admitting: Student

## 2023-09-16 VITALS — BP 125/85 | HR 90 | Ht 68.0 in | Wt 309.4 lb

## 2023-09-16 DIAGNOSIS — N924 Excessive bleeding in the premenopausal period: Secondary | ICD-10-CM

## 2023-09-16 DIAGNOSIS — E038 Other specified hypothyroidism: Secondary | ICD-10-CM

## 2023-09-16 NOTE — Patient Instructions (Addendum)
 We are repeating your thyroid  study today.  Let's keep you off the Seasonale and see how your next few periods are. If they are consistenly heavy, we'll need to do more of a work up for you like we did for [employer].   You guys jknow how to get in touch with me, MyChart is great! Let me know how your next period is.    JINNY Con Gasman, MD

## 2023-09-17 LAB — FERRITIN: Ferritin: 153 ng/mL — ABNORMAL HIGH (ref 15–150)

## 2023-09-17 LAB — CBC WITH DIFFERENTIAL/PLATELET
Basophils Absolute: 0 10*3/uL (ref 0.0–0.2)
Basos: 0 %
EOS (ABSOLUTE): 0.2 10*3/uL (ref 0.0–0.4)
Eos: 2 %
Hematocrit: 43 % (ref 34.0–46.6)
Hemoglobin: 14.2 g/dL (ref 11.1–15.9)
Immature Grans (Abs): 0.1 10*3/uL (ref 0.0–0.1)
Immature Granulocytes: 1 %
Lymphocytes Absolute: 3.1 10*3/uL (ref 0.7–3.1)
Lymphs: 33 %
MCH: 29.5 pg (ref 26.6–33.0)
MCHC: 33 g/dL (ref 31.5–35.7)
MCV: 89 fL (ref 79–97)
Monocytes Absolute: 0.8 10*3/uL (ref 0.1–0.9)
Monocytes: 8 %
Neutrophils Absolute: 5.2 10*3/uL (ref 1.4–7.0)
Neutrophils: 56 %
Platelets: 426 10*3/uL (ref 150–450)
RBC: 4.81 x10E6/uL (ref 3.77–5.28)
RDW: 13 % (ref 11.7–15.4)
WBC: 9.4 10*3/uL (ref 3.4–10.8)

## 2023-09-17 LAB — ESTRADIOL: Estradiol: 46.7 pg/mL

## 2023-09-17 LAB — FSH/LH
FSH: 4.7 m[IU]/mL
LH: 3.8 m[IU]/mL

## 2023-09-17 LAB — IRON AND TIBC
Iron Saturation: 24 % (ref 15–55)
Iron: 86 ug/dL (ref 27–159)
Total Iron Binding Capacity: 352 ug/dL (ref 250–450)
UIBC: 266 ug/dL (ref 131–425)

## 2023-09-17 LAB — PROLACTIN: Prolactin: 10.7 ng/mL (ref 4.8–33.4)

## 2023-09-17 LAB — TSH RFX ON ABNORMAL TO FREE T4: TSH: 3.05 u[IU]/mL (ref 0.450–4.500)

## 2023-09-17 LAB — TESTOSTERONE: Testosterone: 44 ng/dL (ref 13–71)

## 2023-09-18 DIAGNOSIS — N924 Excessive bleeding in the premenopausal period: Secondary | ICD-10-CM | POA: Insufficient documentation

## 2023-09-18 NOTE — Assessment & Plan Note (Addendum)
 Blood for about 14 days earlier this month.  Very atypical for her.  It is also very unusual that she and her sister developed similar symptoms around the same time.  As her sister has improved with stopping the Seasonale birth control, I think it is reasonable to try this same intervention for [facility].  She had a normal pelvic ultrasound in 2022 so I doubt there are any structural abnormalities related to her Bardet-Biedl syndrome contributing, though she is of course at risk of hormonal abnormalities. -FSH, LH, testosterone , estradiol , CBC, TIBC, ferritin, iron, prolactin, TSH -Will be difficult to interpret her hormonal labs given that OCP use can mimic hypogonadotrophic hypogonadism -Holding off on pelvic ultrasound for now -As her sister will be seeing gynecology and it is easier for their parents to get them into the doctor together, I am going ahead and referring her to Gyn so that she can be seen with her sister

## 2023-09-18 NOTE — Assessment & Plan Note (Signed)
 Generally asymptomatic, though certainly possible that her abnormal uterine bleeding could be due to thyroid  dysfunction. -Repeat TSH today, reflex to free T4 for abnormal

## 2023-09-18 NOTE — Progress Notes (Signed)
    SUBJECTIVE:   CHIEF COMPLAINT / HPI:   Subclinical Hypothyroidism Here today for repeat labs after a TSH of 7.2 and free T4 of 1.05 in November.  Feeling generally well otherwise aside from some abnormal uterine bleeding as discussed below.  Abnormal Uterine Bleeding She has been on Seasonale OCP for almost a year now for acne and hirsutism (this was prescribed by her pediatric endocrinologist at [facility]).  Her periods have traditionally been very regular but earlier this month she bled for about 14 days which is highly unusual for her.  Her bleeding has since stopped.   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  Her bleeding was responsive to stopping the Seasonale and doing a Provera challenge.  She has since been seen by her pediatric endocrinologist and has been referred to gynecology. Ritika did have a pelvic ultrasound in December 2022 that was within normal limits.  Bardet-Biedl syndrome (BBS) is a genetic condition that affects many body systems. Symptoms may vary between individuals even within the same family. Some of the major features include extra fingers and toes (polydactyly), obesity related to hyperphagia, developmental delays and learning differences, dental abnormalities, loss of sense of smell, hypogonadotropic hypogonadism, genitourinary abnormalities, renal and liver disease, cardiac abnormalities, and vision concerns. Vision abnormalities typically begin with night blindness in mid childhood, followed by loss of peripheral vision and poor visual acuity.     OBJECTIVE:   BP 125/85   Pulse 90   Ht 5' 8 (1.727 m)   Wt (!) 309 lb 6.4 oz (140.3 kg)   SpO2 100%   BMI 47.04 kg/m   General: alert & oriented, no apparent distress, well groomed HEENT: normocephalic, atraumatic, EOM grossly intact, oral mucosa moist, neck supple Respiratory: normal respiratory effort GI:  non-distended Skin: no rashes, no jaundice Psych: appropriate mood and affect   ASSESSMENT/PLAN:   Subclinical hypothyroidism Generally asymptomatic, though certainly possible that her abnormal uterine bleeding could be due to thyroid  dysfunction. -Repeat TSH today, reflex to free T4 for abnormal  Excessive bleeding in premenopausal period Blood for about 14 days earlier this month.  Very atypical for her.  It is also very unusual that she and her sister developed similar symptoms around the same time.  As her sister has improved with stopping the Seasonale birth control, I think it is reasonable to try this same intervention for [facility].  She had a normal pelvic ultrasound in 2022 so I doubt there are any structural abnormalities related to her Bardet-Biedl syndrome contributing, though she is of course at risk of hormonal abnormalities. -FSH, LH, testosterone , estradiol , CBC, TIBC, ferritin, iron, prolactin, TSH -Will be difficult to interpret her hormonal labs given that OCP use can mimic hypogonadotrophic hypogonadism -Holding off on pelvic ultrasound for now -As her sister will be seeing gynecology and it is easier for their parents to get them into the doctor together, I am going ahead and referring her to Gyn so that she can be seen with her sister     JINNY Con Gasman, MD [facility]

## 2023-09-19 ENCOUNTER — Encounter: Payer: Self-pay | Admitting: Student

## 2023-10-08 ENCOUNTER — Encounter: Payer: Self-pay | Admitting: Student

## 2023-10-26 ENCOUNTER — Other Ambulatory Visit: Payer: Self-pay | Admitting: Student

## 2023-10-26 DIAGNOSIS — E785 Hyperlipidemia, unspecified: Secondary | ICD-10-CM

## 2023-10-26 DIAGNOSIS — R808 Other proteinuria: Secondary | ICD-10-CM

## 2023-10-26 DIAGNOSIS — Q8783 Bardet-Biedl syndrome: Secondary | ICD-10-CM

## 2023-11-21 ENCOUNTER — Ambulatory Visit: Payer: Medicare HMO | Admitting: Cardiovascular Disease

## 2023-12-15 ENCOUNTER — Encounter: Payer: Self-pay | Admitting: Student

## 2023-12-15 ENCOUNTER — Ambulatory Visit: Admitting: Student

## 2023-12-15 VITALS — BP 116/72 | HR 95 | Ht 68.0 in | Wt 308.6 lb

## 2023-12-15 DIAGNOSIS — N924 Excessive bleeding in the premenopausal period: Secondary | ICD-10-CM | POA: Diagnosis not present

## 2023-12-15 DIAGNOSIS — N926 Irregular menstruation, unspecified: Secondary | ICD-10-CM

## 2023-12-15 DIAGNOSIS — Z23 Encounter for immunization: Secondary | ICD-10-CM | POA: Diagnosis not present

## 2023-12-15 MED ORDER — SLYND 4 MG PO TABS: 4.0000 mg | ORAL_TABLET | Freq: Every day | ORAL | 3 refills | Status: DC

## 2023-12-15 NOTE — Patient Instructions (Signed)
 Tacy Expose to see you! Let's see how you do with starting Slynd. I am hopeful this helps you in the same way it has your sister!  Come back to see me in 6 months for your annual labs/imaging.  Alexa Andrews, MD

## 2023-12-15 NOTE — Progress Notes (Unsigned)
    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  A few months back, we discontinued her Seasonale due to heavy menstrual bleeding. This was initially prescribed to regulate her menstrual cycle and was working well up until she experienced the heavy bleeding. Thankfully the bleeding stopped, but unfortunately she has not experienced a menstrual period since January 8th. She is scheduled to have an initial visit with OB/Gyn on 6/12.  She is feeling well otherwise.   Bardet-Biedl syndrome (BBS) is a genetic condition that affects many body systems. Symptoms may vary between individuals even within the same family. Some of the major features include extra fingers and toes (polydactyly), obesity related to hyperphagia, developmental delays and learning differences, dental abnormalities, loss of sense of smell, hypogonadotropic hypogonadism, genitourinary abnormalities, renal and liver disease, cardiac abnormalities, and vision concerns. Vision abnormalities typically begin with night blindness in mid childhood, followed by loss of peripheral vision and poor visual acuity.     OBJECTIVE:   BP 116/72   Pulse 95   Ht 5\' 8"  (1.727 m)   Wt (!) 308 lb 9.6 oz (140 kg)   LMP 08/20/2023   SpO2 100%   BMI 46.92 kg/m   Physical Exam Vitals reviewed.  Constitutional:      General: She is not in acute distress. Cardiovascular:     Rate and Rhythm: Normal rate and regular rhythm.     Heart sounds: No murmur heard. Pulmonary:     Effort: Pulmonary effort is normal. No respiratory distress.     Breath sounds: No wheezing or rales.  Abdominal:     General: There is no distension.     Palpations: Abdomen is soft.  Skin:    General: Skin is warm and dry.  Psychiatric:        Mood and Affect: Mood normal.      ASSESSMENT/PLAN:   Assessment & Plan Irregular menses Heavy bleeding stopped  with cessation of Seasonale, but unfortunately has now gone 4 months without a menstrual period. She has never been sexually active, no possibility of pregnancy.  - Start Slynd (drosperinone) daily  - Has f/u with gyn on 6/12 Encounter for immunization Tdap today      J Lark Plum, MD [facility]

## 2023-12-21 ENCOUNTER — Encounter: Payer: Self-pay | Admitting: Student

## 2023-12-22 ENCOUNTER — Other Ambulatory Visit: Payer: Self-pay

## 2023-12-22 ENCOUNTER — Telehealth: Payer: Self-pay

## 2023-12-22 DIAGNOSIS — N926 Irregular menstruation, unspecified: Secondary | ICD-10-CM

## 2023-12-22 NOTE — Telephone Encounter (Signed)
 Pharmacy Patient Advocate Encounter  Received notification from [employer] that Prior Authorization for SLYND  TABS has been DENIED.  Full denial letter will be uploaded to the media tab. See denial reason below.  We denied coverage for this drug because: Medicare does not allow Medicare Part D drug plans to cover drugs produced by a manufacturer that does not participate in the Manufacturer Discount Program. Your Medicare Part D drug plan was asked to cover the requested drug. The manufacturer of the requested drug has not agreed to fund drug discounts for Medicare members. This drug discount program is called the [employer]. Since the requested drug is produced by a non-participating manufacturer, the request for coverage under your Medicare Part D benefit is denied.   POSSIBLE ALTERNATIVES:    PA #/Case ID/Reference #: [id number]

## 2023-12-22 NOTE — Telephone Encounter (Signed)
 Pharmacy Patient Advocate Encounter   Received notification from Patient Advice Request messages that prior authorization for Slynd  4MG  tablets is required/requested.   Insurance verification completed.   The patient is insured through [employer] .   PA required; PA submitted to above mentioned insurance via CoverMyMeds Key/confirmation #/EOC [id number]. Status is pending

## 2023-12-24 MED ORDER — NORETHINDRONE 0.35 MG PO TABS: 1.0000 | ORAL_TABLET | Freq: Every day | ORAL | 11 refills | Status: DC

## 2024-01-20 ENCOUNTER — Encounter: Payer: Self-pay | Admitting: *Deleted

## 2024-01-23 ENCOUNTER — Encounter: Payer: Self-pay | Admitting: Family Medicine

## 2024-01-23 ENCOUNTER — Ambulatory Visit (INDEPENDENT_AMBULATORY_CARE_PROVIDER_SITE_OTHER): Admitting: Family Medicine

## 2024-01-23 VITALS — BP 123/85 | HR 93 | Wt 318.0 lb

## 2024-01-23 DIAGNOSIS — R6250 Unspecified lack of expected normal physiological development in childhood: Secondary | ICD-10-CM | POA: Diagnosis not present

## 2024-01-23 DIAGNOSIS — Q8783 Bardet-Biedl syndrome: Secondary | ICD-10-CM

## 2024-01-23 DIAGNOSIS — N924 Excessive bleeding in the premenopausal period: Secondary | ICD-10-CM | POA: Diagnosis not present

## 2024-01-23 DIAGNOSIS — Z30011 Encounter for initial prescription of contraceptive pills: Secondary | ICD-10-CM

## 2024-01-23 MED ORDER — SLYND 4 MG PO TABS: 4.0000 mg | ORAL_TABLET | Freq: Every day | ORAL | 4 refills | Status: DC

## 2024-01-23 NOTE — Progress Notes (Signed)
    Contraception/Family Planning VISIT ENCOUNTER NOTE  Subjective:   Safiyya  is a 23 y.o. G0P0000 female here for heavy menstrual bleeding. 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    Previous methods used/with SE: Norethindrone , heavy bleeding breakthrough bleeding  Using tobacco products? No History of Migraines? Yes History of DVT/PE? No Other contraindication for estrogen?  Yes- complex medical history of Bardet Biedl syndrom   Gynecologic History Patient's last menstrual period was 01/11/2024. Contraception: oral progesterone-only contraceptive  Health Maintenance Due  Topic Date Due   Meningococcal B Vaccine (1 of 2 - Standard) Never done   Cervical Cancer Screening (Pap smear)  Never done   COVID-19 Vaccine (7 - Pfizer risk 2024-25 season) 10/18/2023     The following portions of the patient's history were reviewed and updated as appropriate: allergies, current medications, past family history, past medical history, past social history, past surgical history and problem list.  Review of Systems Pertinent items are noted in HPI.   Objective:  BP 123/85   Pulse 93   Wt (!) 318 lb (144.2 kg)   LMP 01/11/2024   BMI 48.35 kg/m  Gen: well appearing, NAD HEENT: no scleral icterus CV: RR Lung: Normal WOB Ext: warm well perfused   Assessment and Plan:   Contraception counseling: Failed norethidrone. Estrogen containing is not preferred. Trial of slynd  from office supply given today. Will try to send slynd  to local pharmacy.   1. Bardet-Biedl syndrome - Drospirenone  (SLYND ) 4 MG TABS; Take 1 tablet (4 mg total) by mouth daily.  Dispense: 84 tablet; Refill: 4  2. Developmental delay - Drospirenone  (SLYND ) 4 MG TABS; Take 1 tablet (4 mg total) by mouth daily.   Dispense: 84 tablet; Refill: 4  3. Excessive bleeding in premenopausal period - Drospirenone  (SLYND ) 4 MG TABS; Take 1 tablet (4 mg total) by mouth daily.  Dispense: 84 tablet; Refill: 4  4. Encounter for initial prescription of contraceptive pills (Primary) - Drospirenone  (SLYND ) 4 MG TABS; Take 1 tablet (4 mg total) by mouth daily.  Dispense: 84 tablet; Refill: 4   Please refer to After Visit Summary for other counseling recommendations.   Return in about 3 months (around 04/24/2024) for Hormone pill/AUB follow up.  Abner Ables, MD, MPH, ABFM Attending Physician Faculty Practice- [facility]

## 2024-01-23 NOTE — Progress Notes (Signed)
 CC: Establish care   Stopped the norethindrone - excessive bleeding and insurance wont cover slynd    Medication Samples have been provided to the patient.  Drug name: Slynd        Strength: 4mg         Qty: 3 boxes  LOT: [id number]  Exp.Date: 06/2025  Dosing instructions: As Directed  The patient has been instructed regarding the correct time, dose, and frequency of taking this medication, including desired effects and most common side effects.   Frederico Jan 10:02 AM 01/23/2024

## 2024-01-29 NOTE — Progress Notes (Deleted)
 Cardiology Office Note  Date:  01/29/2024   ID:  Cassandra Faulkner, DOB [birth year], MRN [id number]  PCP:  Limmie Ren, MD   No chief complaint on file.   HPI:  Cassandra Faulkner is a 23 year old woman with past medical history of Bardet-Biedl syndrome  Retinitis pigmentosa with visual difficulties,S/P surgery for polydactyly.  Morbid obesity PCOS Who presents for f/u of her tachycardia, Bardet-Biedl syndrome   Last seen by myself in clinic 2/24  In follow-up reports she is feeling well Active, takes the dog for frequent walks Tolerating Lipitor, lisinopril  Hobbies include learning how to sew, arts and crafts  Weight is down since prior clinic visit, previously 328, weight down to 306 today Blood pressure well-controlled 120/80  EKG personally reviewed by myself on todays visit Sinus tachycardia rate 103 bpm no significant ST-T wave changes  Normal echo reviewed 07/27/2021 1. Left ventricular ejection fraction, by estimation, is 60 to 65%. The  left ventricle has normal function. The left ventricle has no regional  wall motion abnormalities. Left ventricular diastolic parameters were  normal.   2. Right ventricular systolic function is normal. The right ventricular  size is normal. Tricuspid regurgitation signal is inadequate for assessing  PA pressure.   3. The mitral valve is normal in structure. No evidence of mitral valve  regurgitation. No evidence of mitral stenosis.   4. The aortic valve is normal in structure. Aortic valve regurgitation is  not visualized. No aortic stenosis is present.   5. The inferior vena cava is normal in size with greater than 50%  respiratory variability, suggesting right atrial pressure of 3 mmHg.  ZIO monitor September 2022 reviewed Patient had a min HR of 44 bpm, max HR of 188 bpm, and avg HR of 93 bpm.  Predominant underlying rhythm was Sinus Rhythm.  Isolated SVEs were rare (<1.0%), and no SVE Couplets or SVE Triplets were  present. Isolated VEs were rare (<1.0%), and no VE Couplets or VE Triplets were present.  Patient triggered events (2) not associated with significant arrhythmia  Other past medical history reviewed Previously followed by pediatric cardiology Last seen January 2022 Prior notes indicating  no evidence of cardiac disease.  BPs measured at home have been normal.  She she has been taking atorvastatin  for cholesterol over 200  Also was started on 4 OCPs.  thyroid  dysfunction, which has since improved  Echo done at outside facility shows normal biventricular size and systolic function  PMH:   has a past medical history of Bardet-Biedl syndrome and Chronic otitis media of both ears.  PSH:    Past Surgical History:  Procedure Laterality Date   GALLBLADDER SURGERY      Current Outpatient Medications  Medication Sig Dispense Refill   atorvastatin  (LIPITOR) 20 MG tablet TAKE 1 TABLET (20 MG TOTAL) BY MOUTH DAILY. NEEDS PCP APPOINTMENT BEFORE FURTHER REFILLS 90 tablet 1   doxepin  (SINEQUAN ) 10 MG/ML solution Take 0.6 mLs (6 mg total) by mouth at bedtime. 120 mL 3   Drospirenone  (SLYND ) 4 MG TABS Take 1 tablet (4 mg total) by mouth daily. 84 tablet 4   fish oil-omega-3 fatty acids 1000 MG capsule Take 1 g by mouth daily.     JARDIANCE  10 MG TABS tablet TAKE 1 TABLET BY MOUTH DAILY BEFORE BREAKFAST. 30 tablet 3   lisinopril  (ZESTRIL ) 10 MG tablet Take 1 tablet (10 mg total) by mouth daily. 90 tablet 3   Multiple Vitamins-Minerals (MULTIVITAMIN WITH MINERALS) tablet Take 1 tablet  by mouth daily.     norethindrone  (MICRONOR ) 0.35 MG tablet Take 1 tablet (0.35 mg total) by mouth daily. 28 tablet 11   Setmelanotide Acetate (IMCIVREE) 10 MG/ML SOLN 0.3 mLs     vitamin E 400 UNIT capsule Take 400 Units by mouth daily.     No current facility-administered medications for this visit.    Allergies:   Patient has no known allergies.   Social History:  The patient  reports that she has never smoked.  She has never been exposed to tobacco smoke. She has never used smokeless tobacco. She reports that she does not drink alcohol and does not use drugs.   Family History:   family history includes Breast cancer in her mother; Chromosomal disorder in her sister; Diabetes in her father; Hypertension in her father.    Review of Systems: Review of Systems  Constitutional: Negative.   HENT: Negative.    Respiratory: Negative.    Cardiovascular: Negative.   Gastrointestinal: Negative.   Musculoskeletal: Negative.   Neurological: Negative.   Psychiatric/Behavioral: Negative.    All other systems reviewed and are negative.   PHYSICAL EXAM: VS:  LMP 01/11/2024  , BMI There is no height or weight on file to calculate BMI. Constitutional:  oriented to person, place, and time. No distress.  HENT:  Head: Grossly normal Eyes:  no discharge. No scleral icterus.  Neck: No JVD, no carotid bruits  Cardiovascular: Regular rate and rhythm, no murmurs appreciated Pulmonary/Chest: Clear to auscultation bilaterally, no wheezes or rails Abdominal: Soft.  no distension.  no tenderness.  Musculoskeletal: Normal range of motion Neurological:  normal muscle tone. Coordination normal. No atrophy Skin: Skin warm and dry Psychiatric: normal affect, pleasant  Recent Labs: 06/24/2023: ALT 28; BUN 9; Creatinine, Ser 0.60; Potassium 4.2; Sodium 139 09/16/2023: Hemoglobin 14.2; Platelets 426; TSH 3.050    Lipid Panel Lab Results  Component Value Date   CHOL 156 06/27/2022   HDL 44 06/27/2022   LDLCALC 89 06/27/2022   TRIG 129 06/27/2022      Wt Readings from Last 3 Encounters:  01/23/24 (!) 318 lb (144.2 kg)  12/15/23 (!) 308 lb 9.6 oz (140 kg)  09/16/23 (!) 309 lb 6.4 oz (140.3 kg)      ASSESSMENT AND PLAN:  Problem List Items Addressed This Visit   None  Sinus tachycardia Asymptomatic, prior Zio monitor with average heart rate of 90 Normal echocardiogram No strong indication for  beta-blocker Blood pressure well-controlled Recommended walking program for conditioning, exercise, continued weight loss  Morbid obesity Discussed calorie restriction, Continue walking program Weight is trending down as detailed above  Hyperlipidemia Cholesterol numbers well-controlled on Lipitor daily   Bardet-Biedl syndrome  No notable cardiac issues noted, no structural heart disease Recommend we continue weight management, calorie restriction, exercise program    Total encounter time more than 30 minutes  Greater than 50% was spent in counseling and coordination of care with the patient    Signed, Juanda Noon, M.D., Ph.D. [facility] [place], [place] [phone]

## 2024-01-30 ENCOUNTER — Ambulatory Visit: Admitting: Cardiovascular Disease

## 2024-01-30 DIAGNOSIS — E782 Mixed hyperlipidemia: Secondary | ICD-10-CM

## 2024-01-30 DIAGNOSIS — R7303 Prediabetes: Secondary | ICD-10-CM

## 2024-01-30 DIAGNOSIS — R030 Elevated blood-pressure reading, without diagnosis of hypertension: Secondary | ICD-10-CM

## 2024-01-30 DIAGNOSIS — R0600 Dyspnea, unspecified: Secondary | ICD-10-CM

## 2024-01-30 DIAGNOSIS — Q8783 Bardet-Biedl syndrome: Secondary | ICD-10-CM

## 2024-01-30 DIAGNOSIS — R000 Tachycardia, unspecified: Secondary | ICD-10-CM

## 2024-02-16 DIAGNOSIS — H3552 Pigmentary retinal dystrophy: Secondary | ICD-10-CM | POA: Diagnosis not present

## 2024-02-16 DIAGNOSIS — H53483 Generalized contraction of visual field, bilateral: Secondary | ICD-10-CM | POA: Diagnosis not present

## 2024-02-16 DIAGNOSIS — Q8783 Bardet-Biedl syndrome: Secondary | ICD-10-CM | POA: Diagnosis not present

## 2024-02-19 ENCOUNTER — Ambulatory Visit (INDEPENDENT_AMBULATORY_CARE_PROVIDER_SITE_OTHER): Payer: Medicare HMO | Admitting: Pediatric Genetics

## 2024-02-19 NOTE — Progress Notes (Deleted)
 MEDICAL GENETICS FOLLOW-UP VISIT  Patient name: Cassandra Faulkner DOB: 2000-09-28 Age: 23 y.o. MRN: [id number]  Initial Referring Provider/Specialty: *** / *** Date of Evaluation: 02/19/2024*** Chief Complaint/Reason for Referral: ***  HPI: Cassandra Faulkner is a 23 y.o. female who presents today for follow-up with Genetics to ***. She is accompanied by her *** at today's visit.  To review, their initial visit was on *** at *** old for ***. ***  We recommended *** which showed ***. They return today to discuss these results***.   At each visit: Measure height, weight, head and waist circumference Assess daily physical activity level and dietary intake Measure blood pressure Assess for symptoms of scoliosis, sleep apnea, recurrent infection, IBD, celiac disease, neurogenic bladder, and bladder outflow obstruction Every 6 months Routine dental care Annually Ophthalmology- to include visual field testing, electroretinography, and assessment for cataracts Liver and renal ultrasound Labs- hepatic enzymes, PT, PTT, CBC, serum electrolytes, creatine, BUN, cystatin C, lipid panel, fasting blood glucose, HgbA1c, thyroid  gland function, FSH, LH, estrogen, testosterone  Other Initial echocardiogram to assess for cardiac defects and cardiomyopathy; Complete abdominal ultrasound to assess for laterality defects If normal, further evaluation only needed if cardiac symptoms develop   Since that visit,  Cassandra Faulkner has been working with her PCP Dr. 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 Increased hepatic parenchymal echogenicity suggestive of steatosis. 2. Mild bilateral pelviectasis. Cardiology- Dr. Gollan, [facility] (09/2022) Echocardiogram normal (07/2021) Sinus tachycardia- ZIO monitor in 04/2021 with avg heart rate of 90 Follow up in 12 months- has not occurred, f/u scheduled 04/23/2024 Ophthalmology- Dr. 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 Improvement with superior temporal peripheral depression. No significant changes in back of eye. F/u in 1 year Sleep study 07/2021- no sleep apnea. Did show insomnia. Started on doxepin , which has been helping.*** Endocrinology- Dr. Ishmael, [facility] On setmelanotide (Imcivree) since January 2023 Has gained *** lbs since last visit. Had previously lost at least 25 lbs after starting medication. Appetite decreased, not wanting as large portions or seconds, less snacking. On seasonale for hyperandrogenism. Periods still very irregular. On lisinopril  for hypertension, though recent blood pressure at endo was elevated (normal at home) Nonalcoholic fatty liver disease- liver tests (AST and ALT) are now decreased and in normal range. On lipitor and omega-3. Last saw 10/2022- follow up 05/14/2023 Annual labs Hepatic enzymes- wnl (06/2023) PT- wnl (06/2022) PTT- wnl (06/2022) CBC- wnl (09/2023) Serum electrolytes- wnl (06/2023)  Creatinine- wnl (06/2023) BUN- wnl (06/2023) Cystatin C- wnl (06/2023) Lipid panel- wnl (05/2023) Fasting blood glucose- wnl (10/2022) HgbA1c- 5.2, wnl (05/2023) Thyroid  gland function- TSH wnl (09/2023) FSH/LH- normal (09/2023) Estrogen- normal (09/2023) Testosterone - normal (09/2023)  Pregnancy/Birth History: Cassandra Faulkner was born to a *** year old G***P*** -> *** mother. The pregnancy was uncomplicated/complicated by ***. There were ***no exposures and labs were ***normal. Ultrasounds were normal/abnormal***. Amniotic fluid levels were ***normal. Fetal activity was ***normal. Genetic testing performed during the pregnancy included***/No genetic testing was performed during the pregnancy***.  Cassandra Faulkner was born at *** weeks gestation at [facility] via *** delivery. Apgar scores were ***/***. There were ***no complications. Birth weight ***lb  *** oz/*** kg (***%), birth length *** in/*** cm (***%), head circumference *** cm (***%). They did ***not require a NICU stay. They were discharged home *** days after birth. They ***passed the newborn  screen, hearing test and congenital heart screen.  Developmental History: ***milestones ***school  Social History: Social History   Social History Narrative   Lives with mother, father, and sister    Medications: Current Outpatient Medications on File Prior to Visit  Medication Sig Dispense Refill   atorvastatin  (LIPITOR) 20 MG tablet TAKE 1 TABLET (20 MG TOTAL) BY MOUTH DAILY. NEEDS PCP APPOINTMENT BEFORE FURTHER REFILLS 90 tablet 1   doxepin  (SINEQUAN ) 10 MG/ML solution Take 0.6 mLs (6 mg total) by mouth at bedtime. 120 mL 3   Drospirenone  (SLYND ) 4 MG TABS Take 1 tablet (4 mg total) by mouth daily. 84 tablet 4   fish oil-omega-3 fatty acids 1000 MG capsule Take 1 g by mouth daily.     JARDIANCE  10 MG TABS tablet TAKE 1 TABLET BY MOUTH DAILY BEFORE BREAKFAST. 30 tablet 3   lisinopril  (ZESTRIL ) 10 MG tablet Take 1 tablet (10 mg total) by mouth daily. 90 tablet 3   Multiple Vitamins-Minerals (MULTIVITAMIN WITH MINERALS) tablet Take 1 tablet by mouth daily.     norethindrone  (MICRONOR ) 0.35 MG tablet Take 1 tablet (0.35 mg total) by mouth daily. 28 tablet 11   Setmelanotide Acetate (IMCIVREE) 10 MG/ML SOLN 0.3 mLs     vitamin E 400 UNIT capsule Take 400 Units by mouth daily.     No current facility-administered medications on file prior to visit.    Review of Systems (updates in bold): General: *** Eyes/vision: *** Ears/hearing: *** Dental: *** Respiratory: *** Cardiovascular: *** Gastrointestinal: *** Genitourinary: *** Endocrine: *** Hematologic: *** Immunologic: *** Neurological: *** Psychiatric: *** Musculoskeletal: *** Skin, Hair, Nails: ***  Family History: ***No updates to family history since last visit  Physical Examination: Weight: *** (***%) Height: ***  (***%); mid-parental ***% Head circumference: *** (***%)  LMP 01/11/2024   General: *** Head: *** Eyes: ***, ICD *** cm, OCD *** cm, Calculated***/Measured*** IPD *** cm (***%) Nose: *** Lips/Mouth/Teeth: *** Ears: *** Neck: *** Chest: ***, IND *** cm, CC *** cm, IND/CC ratio *** (***%) Heart: *** Lungs: *** Abdomen: *** Genitalia: *** Skin: *** Hair: *** Neurologic: *** Psych***: *** Back/spine: *** Extremities: *** Hands/Feet: ***, ***Normal fingers and nails, ***2 palmar creases bilaterally, ***Normal toes and nails, ***No clinodactyly, syndactyly or polydactyly  All Genetic testing to date: ***  Pertinent New Labs: ***  Pertinent New Imaging/Studies: ***  Assessment: Cassandra Faulkner is a 23 y.o. female with ***. Prior genetic testing was significant for ***. Growth parameters show ***. Physical examination notable for ***. Family history is ***.  From a surveillance standpoint, Kaylee is largely up to date. We do recommend the following: Annual labs- last checked in either November 2023 or March 2024, continue to follow annually Annual abdominal imaging- due in January 2025 Ophthalmology- annually, due in February 2025 Continue following with other providers as directed (Dr. Marlee, Dr. Ishmael, Dr. Perla)  Recommendations: ***  Buccal samples were obtained during today's visit for the above genetic testing and sent to ***. Results are anticipated in 1-2 months. We will contact the family to discuss results once available and arrange follow-up as needed.    Maris Abascal, MS, [facility] Certified Genetic Counselor  Rumalda Lighter, D.O. Attending Physician Medical Genetics Date: 02/19/2024 Time: ***  Total time spent: *** Time spent includes face to face and non-face to face care for the patient on the date of this encounter (history and physical, genetic counseling, coordination of care, data gathering and/or documentation as outlined)

## 2024-03-08 ENCOUNTER — Other Ambulatory Visit: Payer: Self-pay

## 2024-03-08 DIAGNOSIS — R808 Other proteinuria: Secondary | ICD-10-CM

## 2024-03-10 MED ORDER — EMPAGLIFLOZIN 10 MG PO TABS
10.0000 mg | ORAL_TABLET | Freq: Every day | ORAL | 3 refills | Status: AC
Start: 2024-03-10 — End: ?

## 2024-04-12 ENCOUNTER — Encounter: Payer: Self-pay | Admitting: Family Medicine

## 2024-04-12 DIAGNOSIS — Z30011 Encounter for initial prescription of contraceptive pills: Secondary | ICD-10-CM

## 2024-04-12 DIAGNOSIS — Q8783 Bardet-Biedl syndrome: Secondary | ICD-10-CM

## 2024-04-12 DIAGNOSIS — N924 Excessive bleeding in the premenopausal period: Secondary | ICD-10-CM

## 2024-04-12 DIAGNOSIS — R6250 Unspecified lack of expected normal physiological development in childhood: Secondary | ICD-10-CM

## 2024-04-13 MED ORDER — SLYND 4 MG PO TABS: 4.0000 mg | ORAL_TABLET | Freq: Every day | ORAL | 4 refills | Status: AC

## 2024-04-22 NOTE — Progress Notes (Unsigned)
 Cardiology Office Note  Date:  04/23/2024   ID:  Cassandra Faulkner, DOB 29-May-2001, MRN [id number]  PCP:  Lonnie Earnest, MD   Chief Complaint  Patient presents with   12 month follow up    Doing well.     HPI:  Cassandra Faulkner is a 23 year old woman with past medical history of Bardet-Biedl syndrome  Retinitis pigmentosa with visual difficulties,S/P surgery for polydactyly.  Morbid obesity PCOS Who presents for f/u of her tachycardia, Bardet-Biedl syndrome   Last seen by myself in clinic 2/24 In follow-up today reports doing well Recent tattoo of dragon on forearm  No regular exercise program Enjoys drawing, hobbies include arts and crafts Walks the dog  Weight trending upwards at 306 on clinic visit in February 2024  Now weight up to 317  Blood pressure well-controlled  Lab work from 10/24 Total chol 160, LDL 79 on Lipitor 20 Prior A1c 5.2 last year  EKG personally reviewed by myself on todays visit EKG Interpretation Date/Time:  Friday April 23 2024 09:15:18 EDT Ventricular Rate:  104 PR Interval:  142 QRS Duration:  88 QT Interval:  320 QTC Calculation: 420 R Axis:   46  Text Interpretation: Sinus tachycardia No previous ECGs available Confirmed by Perla Lye [phone]) on 04/23/2024 9:23:03 AM    Normal echo reviewed 07/27/2021 1. Left ventricular ejection fraction, by estimation, is 60 to 65%. The  left ventricle has normal function. The left ventricle has no regional  wall motion abnormalities. Left ventricular diastolic parameters were  normal.   2. Right ventricular systolic function is normal. The right ventricular  size is normal. Tricuspid regurgitation signal is inadequate for assessing  PA pressure.   3. The mitral valve is normal in structure. No evidence of mitral valve  regurgitation. No evidence of mitral stenosis.   4. The aortic valve is normal in structure. Aortic valve regurgitation is  not visualized. No aortic stenosis is  present.   5. The inferior vena cava is normal in size with greater than 50%  respiratory variability, suggesting right atrial pressure of 3 mmHg.  ZIO monitor September 2022 reviewed Patient had a min HR of 44 bpm, max HR of 188 bpm, and avg HR of 93 bpm.  Predominant underlying rhythm was Sinus Rhythm.  Isolated SVEs were rare (<1.0%), and no SVE Couplets or SVE Triplets were present. Isolated VEs were rare (<1.0%), and no VE Couplets or VE Triplets were present.  Patient triggered events (2) not associated with significant arrhythmia  Other past medical history reviewed Previously followed by pediatric cardiology Last seen January 2022 Prior notes indicating  no evidence of cardiac disease.  BPs measured at home have been normal.  She she has been taking atorvastatin  for cholesterol over 200  Also was started on 4 OCPs.  thyroid  dysfunction, which has since improved  Echo done at outside facility shows normal biventricular size and systolic function  PMH:   has a past medical history of Bardet-Biedl syndrome and Chronic otitis media of both ears.  PSH:    Past Surgical History:  Procedure Laterality Date   GALLBLADDER SURGERY      Current Outpatient Medications  Medication Sig Dispense Refill   atorvastatin  (LIPITOR) 20 MG tablet TAKE 1 TABLET (20 MG TOTAL) BY MOUTH DAILY. NEEDS PCP APPOINTMENT BEFORE FURTHER REFILLS 90 tablet 1   doxepin  (SINEQUAN ) 10 MG/ML solution Take 0.6 mLs (6 mg total) by mouth at bedtime. 120 mL 3   Drospirenone  (SLYND ) 4 MG  TABS Take 1 tablet (4 mg total) by mouth daily. 84 tablet 4   empagliflozin  (JARDIANCE ) 10 MG TABS tablet Take 1 tablet (10 mg total) by mouth daily before breakfast. 30 tablet 3   fish oil-omega-3 fatty acids 1000 MG capsule Take 1 g by mouth daily.     lisinopril  (ZESTRIL ) 10 MG tablet Take 1 tablet (10 mg total) by mouth daily. 90 tablet 3   Multiple Vitamins-Minerals (MULTIVITAMIN WITH MINERALS) tablet Take 1 tablet by mouth  daily.     Setmelanotide Acetate (IMCIVREE) 10 MG/ML SOLN 0.3 mLs     vitamin E 400 UNIT capsule Take 400 Units by mouth daily.     No current facility-administered medications for this visit.    Allergies:   Patient has no known allergies.   Social History:  The patient  reports that she has never smoked. She has never been exposed to tobacco smoke. She has never used smokeless tobacco. She reports that she does not drink alcohol and does not use drugs.   Family History:   family history includes Breast cancer in her mother; Chromosomal disorder in her sister; Diabetes in her father; Hypertension in her father.    Review of Systems: Review of Systems  Constitutional: Negative.   HENT: Negative.    Respiratory: Negative.    Cardiovascular: Negative.   Gastrointestinal: Negative.   Musculoskeletal: Negative.   Neurological: Negative.   Psychiatric/Behavioral: Negative.    All other systems reviewed and are negative.  PHYSICAL EXAM: VS:  BP 110/68 (BP Location: Left Arm, Patient Position: Sitting, Cuff Size: Large)   Pulse (!) 104   Ht 5' 10 (1.778 m)   Wt (!) 317 lb 2 oz (143.8 kg)   SpO2 98%   BMI 45.50 kg/m  , BMI Body mass index is 45.5 kg/m. Constitutional:  oriented to person, place, and time. No distress.  HENT:  Head: Grossly normal Eyes:  no discharge. No scleral icterus.  Neck: No JVD, no carotid bruits  Cardiovascular: Regular rate and rhythm, no murmurs appreciated Pulmonary/Chest: Clear to auscultation bilaterally, no wheezes or rales Abdominal: Soft.  no distension.  no tenderness.  Musculoskeletal: Normal range of motion Neurological:  normal muscle tone. Coordination normal. No atrophy Skin: Skin warm and dry Psychiatric: normal affect, pleasant   Recent Labs: 06/24/2023: ALT 28; BUN 9; Creatinine, Ser 0.60; Potassium 4.2; Sodium 139 09/16/2023: Hemoglobin 14.2; Platelets 426; TSH 3.050    Lipid Panel Lab Results  Component Value Date   CHOL 156  06/27/2022   HDL 44 06/27/2022   LDLCALC 89 06/27/2022   TRIG 129 06/27/2022     Wt Readings from Last 3 Encounters:  04/23/24 (!) 317 lb 2 oz (143.8 kg)  01/23/24 (!) 318 lb (144.2 kg)  12/15/23 (!) 308 lb 9.6 oz (140 kg)     ASSESSMENT AND PLAN:  Problem List Items Addressed This Visit       Cardiology Problems   Hyperlipidemia     Other   Bardet-Biedl syndrome - Primary   Relevant Orders   EKG 12-Lead (Completed)   Prediabetes   Tachycardia   Relevant Orders   EKG 12-Lead (Completed)   Other Visit Diagnoses       Dyspnea, unspecified type       Relevant Orders   EKG 12-Lead (Completed)      Sinus tachycardia Asymptomatic, prior Zio monitor with average heart rate of 90 Normal echocardiogram Elevated heart rate again on today's visit 103, asymptomatic No strong indication  for beta-blocker Blood pressure well-controlled Recommend exercise program for conditioning  Morbid obesity Recommend walking program, calorie restriction She is working with primary care, receiving injection imcivree  Hyperlipidemia Recommend she continue her Lipitor 20 daily  Bardet-Biedl syndrome  No notable cardiac issues noted,  Normal clinical exam, prior echocardiogram normal  Cardiac risk profile Non-smoker, no diabetes, cholesterol well-controlled, blood pressure controlled Lifestyle modification recommended with low carbohydrate intake and walking program   Signed, Velinda Lunger, M.D., Ph.D. [facility] [place], [place] [phone]

## 2024-04-23 ENCOUNTER — Encounter: Payer: Self-pay | Admitting: Cardiovascular Disease

## 2024-04-23 ENCOUNTER — Ambulatory Visit: Attending: Cardiovascular Disease | Admitting: Cardiovascular Disease

## 2024-04-23 VITALS — BP 110/68 | HR 104 | Ht 70.0 in | Wt 317.1 lb

## 2024-04-23 DIAGNOSIS — R0600 Dyspnea, unspecified: Secondary | ICD-10-CM | POA: Diagnosis not present

## 2024-04-23 DIAGNOSIS — E782 Mixed hyperlipidemia: Secondary | ICD-10-CM

## 2024-04-23 DIAGNOSIS — Q8783 Bardet-Biedl syndrome: Secondary | ICD-10-CM

## 2024-04-23 DIAGNOSIS — R000 Tachycardia, unspecified: Secondary | ICD-10-CM

## 2024-04-23 DIAGNOSIS — R7303 Prediabetes: Secondary | ICD-10-CM

## 2024-04-23 NOTE — Patient Instructions (Signed)
 Medication Instructions:  No changes  If you need a refill on your cardiac medications before your next appointment, please call your pharmacy.   Lab work: No new labs needed  Testing/Procedures: No new testing needed  Follow-Up: At [facility], you and your health needs are our priority.  As part of our continuing mission to provide you with exceptional heart care, we have created designated Provider Care Teams.  These Care Teams include your primary Cardiologist (physician) and Advanced Practice Providers (APPs -  Physician Assistants and Nurse Practitioners) who all work together to provide you with the care you need, when you need it.  You will need a follow up appointment as needed, consider one year  Providers on your designated Care Team:   Lonni Meager, NP Bernardino Bring, PA-C Cadence [place], [place]  COVID-19 Vaccine Information can be found at: [email or web address] For questions related to vaccine distribution or appointments, please email [email or web address] or call [phone].

## 2024-04-30 ENCOUNTER — Encounter: Payer: Self-pay | Admitting: Family Medicine

## 2024-04-30 ENCOUNTER — Ambulatory Visit (INDEPENDENT_AMBULATORY_CARE_PROVIDER_SITE_OTHER): Admitting: Family Medicine

## 2024-04-30 VITALS — BP 132/84 | HR 96 | Wt 322.0 lb

## 2024-04-30 DIAGNOSIS — N926 Irregular menstruation, unspecified: Secondary | ICD-10-CM

## 2024-04-30 NOTE — Progress Notes (Signed)
   GYNECOLOGY PROBLEM  VISIT ENCOUNTER NOTE  Subjective:   Cassandra Faulkner is a 23 y.o. G0P0000 female here for a problem GYN visit.  Current complaints: none-- bleeding is improved on slynd . We are trying to get this covered through insurance and it was sent to [facility] pharmacy. Patient reports more regular periods which is confirmed by her father who brings her for the visit (he was informed by his wife as well).   Denies abnormal vaginal bleeding, discharge, pelvic pain, problems with intercourse or other gynecologic concerns.    Gynecologic History No LMP recorded.  Contraception: slynd   Health Maintenance Due  Topic Date Due   Meningococcal B Vaccine (1 of 2 - Standard) Never done   Cervical Cancer Screening (Pap smear)  Never done   Influenza Vaccine  03/12/2024   COVID-19 Vaccine (7 - Pfizer risk 2024-25 season) 04/12/2024   Medicare Annual Wellness (AWV)  05/04/2024    The following portions of the patient's history were reviewed and updated as appropriate: allergies, current medications, past family history, past medical history, past social history, past surgical history and problem list.  Review of Systems Pertinent items are noted in HPI.   Objective:  BP 132/84   Pulse 96   Wt (!) 322 lb (146.1 kg)   BMI 46.20 kg/m   Gen: well appearing, NAD HEENT: no scleral icterus CV: RR Lung: Normal WOB Ext: warm well perfused  Assessment and Plan:   1. Irregular bleeding (Primary) - improved with Slynd  - given sample from office supply - Reviewed to expect a call from [facility] to help with home delivery of additional packs  Please refer to After Visit Summary for other counseling recommendations.   No follow-ups on file.  Suzen Maryan Masters, MD, MPH, ABFM Attending Physician Faculty Practice- [facility]

## 2024-04-30 NOTE — Progress Notes (Signed)
 CC: Follow up AUB/ Pill  Stating that she has not noticed any difference in her vaginal bleeding   Came and picked up sample medications about 20 days ago

## 2024-04-30 NOTE — Progress Notes (Signed)
 Medication Samples have been provided to the patient.  Drug name: slynd        Strength: 4mg         Qty: 28x2  LOT: [id number]  Exp.Date: 06/2026  The patient has been instructed regarding the correct time, dose, and frequency of taking this medication, including desired effects and most common side effects.   Cassandra Faulkner 12:06 PM 04/30/2024

## 2024-06-09 ENCOUNTER — Telehealth: Payer: Self-pay

## 2024-06-09 ENCOUNTER — Other Ambulatory Visit (HOSPITAL_COMMUNITY): Payer: Self-pay

## 2024-06-09 NOTE — Telephone Encounter (Signed)
 Prior authorization submitted for JARDIANCE  10MG  to [place] [employer] via [employer].   Confirmation # [id number]

## 2024-06-10 NOTE — Telephone Encounter (Signed)
 Pharmacy Patient Advocate Encounter  Received notification from [place] [employer]ICA[employer] that Prior Authorization for JARDIANCE  10MG  has been DENIED.  No reason given; No denial letter received via Fax or CMM. It has been requested and will be uploaded to the media tab once received.

## 2024-07-05 ENCOUNTER — Other Ambulatory Visit: Payer: Self-pay | Admitting: *Deleted

## 2024-07-05 DIAGNOSIS — E785 Hyperlipidemia, unspecified: Secondary | ICD-10-CM

## 2024-07-05 DIAGNOSIS — Q8783 Bardet-Biedl syndrome: Secondary | ICD-10-CM

## 2024-07-12 ENCOUNTER — Other Ambulatory Visit: Payer: Self-pay

## 2024-07-12 DIAGNOSIS — E785 Hyperlipidemia, unspecified: Secondary | ICD-10-CM

## 2024-07-12 DIAGNOSIS — Q8783 Bardet-Biedl syndrome: Secondary | ICD-10-CM
